# Patient Record
Sex: Female | Born: 1965 | Race: Black or African American | Hispanic: No | Marital: Married | Smoking: Never smoker
Health system: Southern US, Community
[De-identification: ages and names within clinical notes are randomized; demographics above are authoritative.]

## PROBLEM LIST (undated history)

## (undated) DIAGNOSIS — R002 Palpitations: Secondary | ICD-10-CM

## (undated) DIAGNOSIS — Q6589 Other specified congenital deformities of hip: Secondary | ICD-10-CM

## (undated) DIAGNOSIS — M1990 Unspecified osteoarthritis, unspecified site: Secondary | ICD-10-CM

## (undated) DIAGNOSIS — F32A Depression, unspecified: Secondary | ICD-10-CM

## (undated) DIAGNOSIS — F419 Anxiety disorder, unspecified: Secondary | ICD-10-CM

## (undated) HISTORY — DX: Unspecified osteoarthritis, unspecified site: M19.90

## (undated) HISTORY — PX: JOINT REPLACEMENT: SHX530

## (undated) HISTORY — PX: BUNIONECTOMY: SHX129

## (undated) HISTORY — PX: MANDIBLE SURGERY: SHX707

## (undated) HISTORY — DX: Anxiety disorder, unspecified: F41.9

## (undated) HISTORY — DX: Depression, unspecified: F32.A

---

## 2005-11-20 ENCOUNTER — Ambulatory Visit: Payer: Self-pay

## 2009-03-05 ENCOUNTER — Emergency Department: Payer: Self-pay | Admitting: Unknown Physician Specialty

## 2009-03-07 ENCOUNTER — Ambulatory Visit: Payer: Self-pay

## 2009-03-19 ENCOUNTER — Ambulatory Visit: Payer: Self-pay

## 2009-08-07 ENCOUNTER — Ambulatory Visit: Payer: Self-pay | Admitting: Obstetrics and Gynecology

## 2010-10-02 ENCOUNTER — Ambulatory Visit: Payer: Self-pay

## 2011-06-24 ENCOUNTER — Other Ambulatory Visit (HOSPITAL_COMMUNITY): Payer: Self-pay

## 2011-06-30 ENCOUNTER — Encounter (HOSPITAL_COMMUNITY): Admission: RE | Payer: Self-pay | Source: Ambulatory Visit

## 2011-06-30 ENCOUNTER — Ambulatory Visit (HOSPITAL_COMMUNITY): Admission: RE | Admit: 2011-06-30 | Payer: Self-pay | Source: Ambulatory Visit | Admitting: Obstetrics and Gynecology

## 2011-06-30 SURGERY — LAPAROSCOPY OPERATIVE
Anesthesia: Choice

## 2011-08-16 ENCOUNTER — Ambulatory Visit: Payer: Self-pay | Admitting: Podiatry

## 2011-10-03 ENCOUNTER — Ambulatory Visit: Payer: Self-pay

## 2012-10-04 ENCOUNTER — Ambulatory Visit: Payer: Self-pay

## 2013-10-05 ENCOUNTER — Ambulatory Visit: Payer: Self-pay

## 2015-09-24 ENCOUNTER — Other Ambulatory Visit: Payer: Self-pay | Admitting: Obstetrics and Gynecology

## 2015-09-24 DIAGNOSIS — Z1231 Encounter for screening mammogram for malignant neoplasm of breast: Secondary | ICD-10-CM

## 2015-10-04 ENCOUNTER — Other Ambulatory Visit: Payer: Self-pay | Admitting: Obstetrics and Gynecology

## 2015-10-04 ENCOUNTER — Ambulatory Visit
Admission: RE | Admit: 2015-10-04 | Discharge: 2015-10-04 | Disposition: A | Discharge status: Home / Self Care | Payer: BC Managed Care – PPO | Source: Ambulatory Visit | Attending: Obstetrics and Gynecology | Admitting: Obstetrics and Gynecology

## 2015-10-04 DIAGNOSIS — Z1231 Encounter for screening mammogram for malignant neoplasm of breast: Secondary | ICD-10-CM | POA: Diagnosis not present

## 2016-10-01 ENCOUNTER — Other Ambulatory Visit: Payer: Self-pay | Admitting: Obstetrics and Gynecology

## 2016-10-01 DIAGNOSIS — Z1231 Encounter for screening mammogram for malignant neoplasm of breast: Secondary | ICD-10-CM

## 2016-10-14 ENCOUNTER — Ambulatory Visit
Admission: RE | Admit: 2016-10-14 | Discharge: 2016-10-14 | Disposition: A | Discharge status: Home / Self Care | Payer: BC Managed Care – PPO | Source: Ambulatory Visit | Attending: Obstetrics and Gynecology | Admitting: Obstetrics and Gynecology

## 2016-10-14 DIAGNOSIS — Z1231 Encounter for screening mammogram for malignant neoplasm of breast: Secondary | ICD-10-CM | POA: Diagnosis not present

## 2017-10-05 ENCOUNTER — Other Ambulatory Visit: Payer: Self-pay | Admitting: Obstetrics and Gynecology

## 2017-10-05 DIAGNOSIS — Z1231 Encounter for screening mammogram for malignant neoplasm of breast: Secondary | ICD-10-CM

## 2017-10-19 ENCOUNTER — Other Ambulatory Visit: Payer: Self-pay | Admitting: Student

## 2017-10-19 DIAGNOSIS — G8929 Other chronic pain: Secondary | ICD-10-CM

## 2017-10-19 DIAGNOSIS — M5412 Radiculopathy, cervical region: Secondary | ICD-10-CM

## 2017-10-19 DIAGNOSIS — M5442 Lumbago with sciatica, left side: Secondary | ICD-10-CM

## 2017-10-21 ENCOUNTER — Ambulatory Visit
Admission: RE | Admit: 2017-10-21 | Discharge: 2017-10-21 | Disposition: A | Discharge status: Home / Self Care | Payer: BC Managed Care – PPO | Source: Ambulatory Visit | Attending: Obstetrics and Gynecology | Admitting: Obstetrics and Gynecology

## 2017-10-21 DIAGNOSIS — Z1231 Encounter for screening mammogram for malignant neoplasm of breast: Secondary | ICD-10-CM

## 2017-10-22 ENCOUNTER — Other Ambulatory Visit: Payer: Self-pay | Admitting: Obstetrics and Gynecology

## 2017-10-22 DIAGNOSIS — N6489 Other specified disorders of breast: Secondary | ICD-10-CM

## 2017-10-22 DIAGNOSIS — R928 Other abnormal and inconclusive findings on diagnostic imaging of breast: Secondary | ICD-10-CM

## 2017-10-28 ENCOUNTER — Ambulatory Visit
Admission: RE | Admit: 2017-10-28 | Discharge: 2017-10-28 | Disposition: A | Discharge status: Home / Self Care | Payer: BC Managed Care – PPO | Source: Ambulatory Visit | Attending: Obstetrics and Gynecology | Admitting: Obstetrics and Gynecology

## 2017-10-28 DIAGNOSIS — N6489 Other specified disorders of breast: Secondary | ICD-10-CM | POA: Diagnosis present

## 2017-10-28 DIAGNOSIS — R928 Other abnormal and inconclusive findings on diagnostic imaging of breast: Secondary | ICD-10-CM | POA: Diagnosis not present

## 2017-11-03 ENCOUNTER — Ambulatory Visit
Admission: RE | Admit: 2017-11-03 | Discharge: 2017-11-03 | Disposition: A | Discharge status: Home / Self Care | Payer: BC Managed Care – PPO | Source: Ambulatory Visit | Attending: Student | Admitting: Student

## 2017-11-03 DIAGNOSIS — G8929 Other chronic pain: Secondary | ICD-10-CM | POA: Diagnosis present

## 2017-11-03 DIAGNOSIS — M5412 Radiculopathy, cervical region: Secondary | ICD-10-CM | POA: Insufficient documentation

## 2017-11-03 DIAGNOSIS — M48061 Spinal stenosis, lumbar region without neurogenic claudication: Secondary | ICD-10-CM | POA: Insufficient documentation

## 2017-11-03 DIAGNOSIS — M5442 Lumbago with sciatica, left side: Principal | ICD-10-CM

## 2017-11-03 DIAGNOSIS — M4802 Spinal stenosis, cervical region: Secondary | ICD-10-CM | POA: Insufficient documentation

## 2017-11-03 DIAGNOSIS — M2578 Osteophyte, vertebrae: Secondary | ICD-10-CM | POA: Diagnosis not present

## 2018-10-20 ENCOUNTER — Other Ambulatory Visit: Payer: Self-pay | Admitting: Obstetrics and Gynecology

## 2018-10-20 DIAGNOSIS — Z1231 Encounter for screening mammogram for malignant neoplasm of breast: Secondary | ICD-10-CM

## 2018-11-29 ENCOUNTER — Ambulatory Visit
Admission: RE | Admit: 2018-11-29 | Discharge: 2018-11-29 | Disposition: A | Discharge status: Home / Self Care | Payer: BC Managed Care – PPO | Source: Ambulatory Visit | Attending: Obstetrics and Gynecology | Admitting: Obstetrics and Gynecology

## 2018-11-29 DIAGNOSIS — Z1231 Encounter for screening mammogram for malignant neoplasm of breast: Secondary | ICD-10-CM | POA: Diagnosis present

## 2019-02-23 ENCOUNTER — Ambulatory Visit: Payer: BC Managed Care – PPO | Admitting: Podiatry

## 2019-03-16 ENCOUNTER — Encounter: Payer: BC Managed Care – PPO | Admitting: Podiatry

## 2019-03-16 ENCOUNTER — Ambulatory Visit: Payer: BC Managed Care – PPO

## 2019-03-21 ENCOUNTER — Ambulatory Visit: Payer: BC Managed Care – PPO | Admitting: Podiatry

## 2019-03-21 DIAGNOSIS — R002 Palpitations: Secondary | ICD-10-CM | POA: Insufficient documentation

## 2019-03-21 DIAGNOSIS — I491 Atrial premature depolarization: Secondary | ICD-10-CM | POA: Insufficient documentation

## 2019-03-22 NOTE — Progress Notes (Signed)
This encounter was created in error - please disregard.

## 2019-04-04 ENCOUNTER — Ambulatory Visit (INDEPENDENT_AMBULATORY_CARE_PROVIDER_SITE_OTHER): Payer: BC Managed Care – PPO

## 2019-04-04 ENCOUNTER — Encounter: Payer: Self-pay | Admitting: Podiatry

## 2019-04-04 ENCOUNTER — Ambulatory Visit: Payer: BC Managed Care – PPO | Admitting: Podiatry

## 2019-04-04 ENCOUNTER — Other Ambulatory Visit: Payer: Self-pay

## 2019-04-04 DIAGNOSIS — M2011 Hallux valgus (acquired), right foot: Secondary | ICD-10-CM

## 2019-04-04 DIAGNOSIS — I493 Ventricular premature depolarization: Secondary | ICD-10-CM | POA: Insufficient documentation

## 2019-04-04 DIAGNOSIS — M2041 Other hammer toe(s) (acquired), right foot: Secondary | ICD-10-CM | POA: Diagnosis not present

## 2019-04-04 DIAGNOSIS — M2012 Hallux valgus (acquired), left foot: Secondary | ICD-10-CM | POA: Diagnosis not present

## 2019-04-04 DIAGNOSIS — M2042 Other hammer toe(s) (acquired), left foot: Secondary | ICD-10-CM

## 2019-04-04 NOTE — Patient Instructions (Signed)
Pre-Operative Instructions  Congratulations, you have decided to take an important step towards improving your quality of life.  You can be assured that the doctors and staff at [facility] will be with you every step of the way.  Here are some important things you should know:  1. Plan to be at the surgery center/hospital at least 1 (one) hour prior to your scheduled time, unless otherwise directed by the surgical center/hospital staff.  You must have a responsible adult accompany you, remain during the surgery and drive you home.  Make sure you have directions to the surgical center/hospital to ensure you arrive on time. 2. If you are having surgery at [facility] or [facility], you will need a copy of your medical history and physical form from your family physician within one month prior to the date of surgery. We will give you a form for your primary physician to complete.  3. We make every effort to accommodate the date you request for surgery.  However, there are times where surgery dates or times have to be moved.  We will contact you as soon as possible if a change in schedule is required.   4. No aspirin/ibuprofen for one week before surgery.  If you are on aspirin, any non-steroidal anti-inflammatory medications (Mobic, Aleve, Ibuprofen) should not be taken seven (7) days prior to your surgery.  You make take Tylenol for pain prior to surgery.  5. Medications - If you are taking daily heart and blood pressure medications, seizure, reflux, allergy, asthma, anxiety, pain or diabetes medications, make sure you notify the surgery center/hospital before the day of surgery so they can tell you which medications you should take or avoid the day of surgery. 6. No food or drink after midnight the night before surgery unless directed otherwise by surgical center/hospital staff. 7. No alcoholic beverages 24-hours prior to surgery.  No smoking 24-hours prior or 24-hours after  surgery. 8. Wear loose pants or shorts. They should be loose enough to fit over bandages, boots, and casts. 9. Don't wear slip-on shoes. Sneakers are preferred. 10. Bring your boot with you to the surgery center/hospital.  Also bring crutches or a Carmack if your physician has prescribed it for you.  If you do not have this equipment, it will be provided for you after surgery. 11. If you have not been contacted by the surgery center/hospital by the day before your surgery, call to confirm the date and time of your surgery. 12. Leave-time from work may vary depending on the type of surgery you have.  Appropriate arrangements should be made prior to surgery with your employer. 13. Prescriptions will be provided immediately following surgery by your doctor.  Fill these as soon as possible after surgery and take the medication as directed. Pain medications will not be refilled on weekends and must be approved by the doctor. 14. Remove nail polish on the operative foot and avoid getting pedicures prior to surgery. 15. Wash the night before surgery.  The night before surgery wash the foot and leg well with water and the antibacterial soap provided. Be sure to pay special attention to beneath the toenails and in between the toes.  Wash for at least three (3) minutes. Rinse thoroughly with water and dry well with a towel.  Perform this wash unless told not to do so by your physician.  Enclosed: 1 Ice pack (please put in freezer the night before surgery)   1 Hibiclens skin cleaner     Pre-op instructions  If you have any questions regarding the instructions, please do not hesitate to call our office.  [place]: [address], [place], [place] [address] -- [phone]  [place]: [address] [place], [place] [address] -- [phone]  [place]: [address], [place], [place] [address] -- [phone]   Website: [email or web address] 

## 2019-04-04 NOTE — Progress Notes (Signed)
Subjective:  Patient ID: Tiffany Rasmussen, female    DOB: 12-24-1965,  MRN: [id number] HPI Chief Complaint  Patient presents with  . Bunions    Patient presents today for painful bunion left foot x years.  She says its now effecting her gait.  She says she has random sharp pains and feels like a needle is sticking her foot.  She has tried diffrent insoles and shoes for relief    53 y.o. female presents with the above complaint.   ROS: Denies fever chills nausea vomiting muscle aches pains calf pain back pain chest pain shortness of breath.  Does relate some cardiac issues that she is currently being evaluated for.  No past medical history on file. No past surgical history on file.  Current Outpatient Medications:  .  Probiotic Product (PROBIOTIC DAILY PO), Take by mouth., Disp: , Rfl:  .  aspirin EC 81 MG tablet, Take by mouth., Disp: , Rfl:  .  cyclobenzaprine (FLEXERIL) 10 MG tablet, Take 5-10 mg by mouth at bedtime as needed., Disp: , Rfl:  .  LORazepam (ATIVAN) 0.5 MG tablet, TAKE 1 TABLET BY MOUTH EVERY 8 HOURS AS NEEDED FOR ANXIETY, Disp: , Rfl:  .  magnesium oxide (MAG-OX) 400 MG tablet, Take by mouth., Disp: , Rfl:  .  Multiple Vitamin (MULTI-VITAMIN) tablet, Take by mouth., Disp: , Rfl:  .  vitamin k 100 MCG tablet, Take by mouth., Disp: , Rfl:   Allergies  Allergen Reactions  . Lactose Intolerance (Gi) Other (See Comments)    GI upset and bloating   Review of Systems Objective:  There were no vitals filed for this visit.  General: Well developed, nourished, in no acute distress, alert and oriented x3   Dermatological: Skin is warm, dry and supple bilateral. Nails x 10 are well maintained; remaining integument appears unremarkable at this time. There are no open sores, no preulcerative lesions, no rash or signs of infection present.  Vascular: Dorsalis Pedis artery and Posterior Tibial artery pedal pulses are 2/4 bilateral with immedate capillary fill time. Pedal hair  growth present. No varicosities and no lower extremity edema present bilateral.   Neruologic: Grossly intact via light touch bilateral. Vibratory intact via tuning fork bilateral. Protective threshold with Semmes Wienstein monofilament intact to all pedal sites bilateral. Patellar and Achilles deep tendon reflexes 2+ bilateral. No Babinski or clonus noted bilateral.   Musculoskeletal: No gross boney pedal deformities bilateral. No pain, crepitus, or limitation noted with foot and ankle range of motion bilateral. Muscular strength 5/5 in all groups tested bilateral.  Gait: Unassisted, Nonantalgic.    Radiographs:  Radiographs taken today demonstrate an osseously mature individual with pes planus mild to moderate tailor's bunion deformity as well as increase in the first intermetatarsal angle greater than normal value with an excessively long second metatarsal.  This is all noted on the left foot.  No acute findings.  Assessment & Plan:   Assessment: Hallux valgus left second metatarsal plantarflexed left tailor's bunion deformity left  Plan: Discussed etiology pathology conservative versus surgical therapies.  At this point we consented her today for an [facility] bunion repair second metatarsal osteotomy and 1/5 metatarsal osteotomy we did discuss the possible postop complications which may include but not limited to postop pain bleeding swelling infection recurrence need for further surgery overcorrection under correction loss of digit loss of limb loss of life.  We will wait for cardiac clearance.  We did dispense a Cam Shiraishi today information regarding the surgery  center anesthesia as well as instructions for the morning of surgery.     Dorthea Maina T. [place], [place]

## 2019-04-05 ENCOUNTER — Telehealth: Payer: Self-pay | Admitting: Podiatry

## 2019-04-05 NOTE — Telephone Encounter (Signed)
Called pt to get her scheduled for surgery. Ended up scheduling pt for 04/29/2019 but pt will let me know if she cannot do that day and she would have to schedule for 10/07/2019. I told her if she had to schedule that date, she would have to come back in to resign her consent forms as they are only good for six months. Told pt to call me with any questions and/or concerns.

## 2019-04-05 NOTE — Telephone Encounter (Signed)
I'm calling to schedule surgery on my left foot with Dr. Milinda Pointer.

## 2019-04-06 ENCOUNTER — Telehealth: Payer: Self-pay | Admitting: Podiatry

## 2019-04-06 NOTE — Telephone Encounter (Signed)
Pt called to reschedule her surgery to 07 October 2019 due to work. I told pt she would need to come back in to see Dr. Milinda Pointer to resign her consent forms as they are only good for six months. I went ahead and scheduled pt to come in on Monday, 07 June 21 at 3:45 pm. Told pt to call with any questions/concerns she may have.

## 2019-05-31 IMAGING — MR MR LUMBAR SPINE W/O CM
5 series · 37 of 48 positions shown · non-contrast
Comparison: None.

CLINICAL DATA: Low back pain radiating to the left leg

EXAM:
MRI LUMBAR SPINE WITHOUT CONTRAST
TECHNIQUE: Multiplanar, multisequence MR imaging of the lumbar spine was
performed. No intravenous contrast was administered.

[Series 3: T2 · sagittal · 4.0mm · 0.88mm/px · 7 of 19 slices shown (1 of 2)]
[im 1/19]
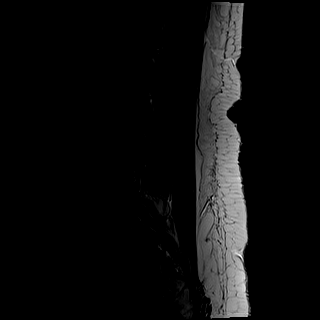
[im 4/19]
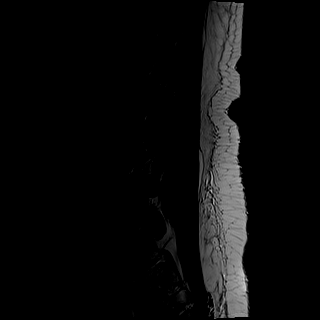
[im 7/19]
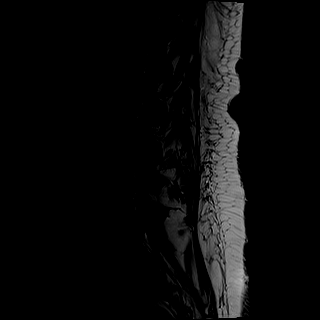
[im 10/19]
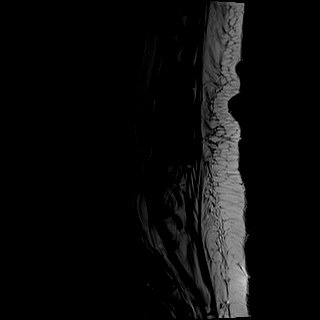
[im 13/19]
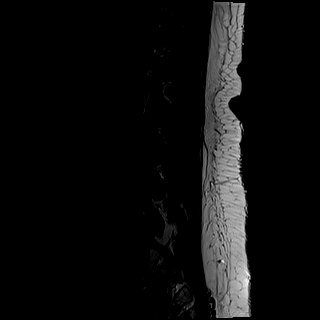
[im 16/19]
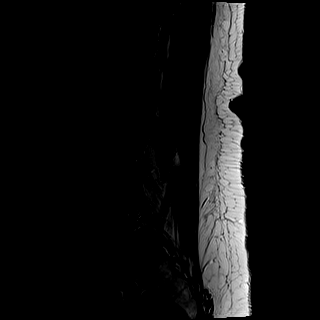
[im 19/19]
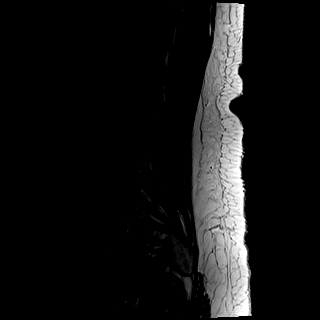

[Series 4: T1 · sagittal · 4.0mm · 0.88mm/px · 7 of 19 slices shown (1 of 2)]
[im 1/19]
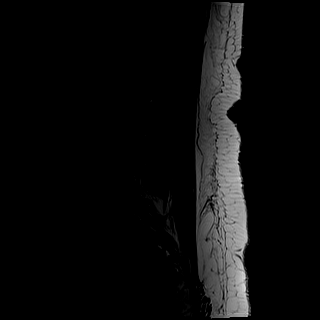
[im 4/19]
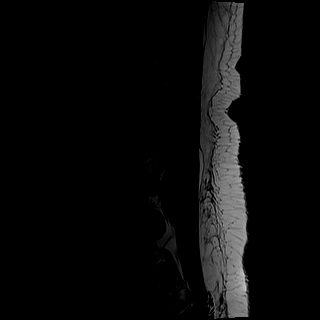
[im 7/19]
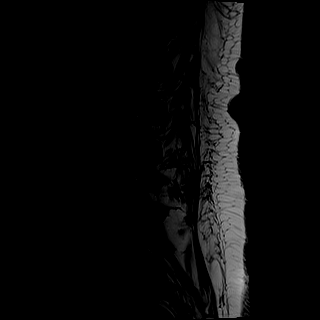
[im 10/19]
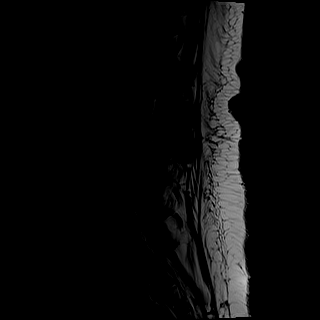
[im 13/19]
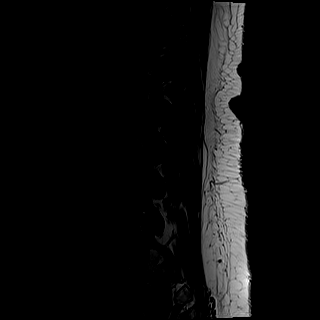
[im 16/19]
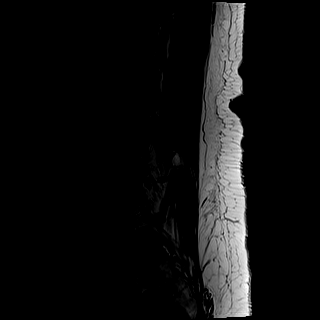
[im 19/19]
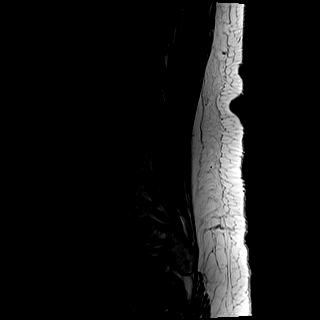

[Series 5: STIR · sagittal · 4.0mm · 0.88mm/px · 6 of 19 slices shown]
[im 1/19]
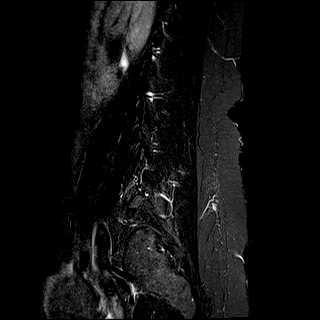
[im 4/19]
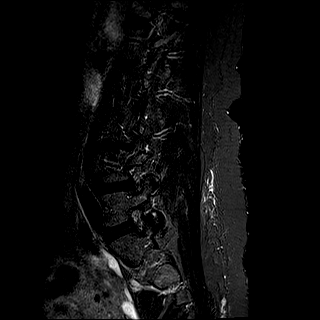
[im 8/19]
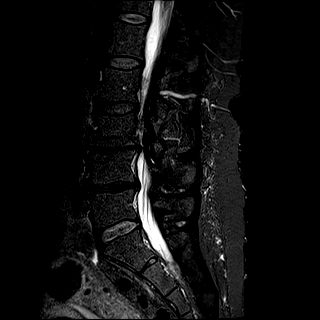
[im 11/19]
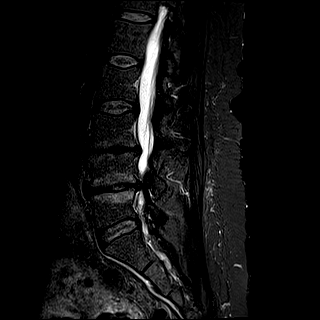
[im 15/19]
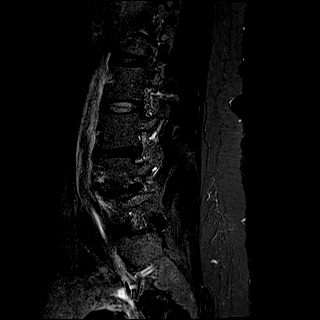
[im 19/19]
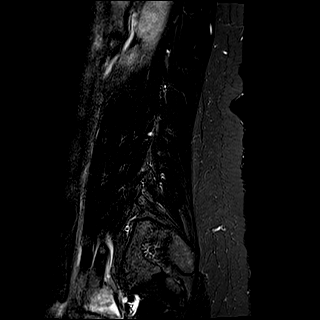

[Series 6: T2 · axial · 4.0mm · 0.78mm/px · z∈[-110,+117]mm · 9 of 42 slices shown (2 of 2)]
[im 1/42]
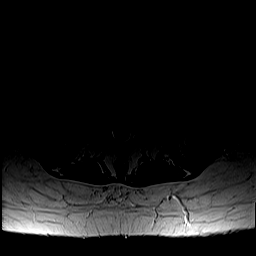
[im 4/42]
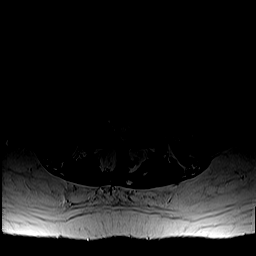
[im 7/42]
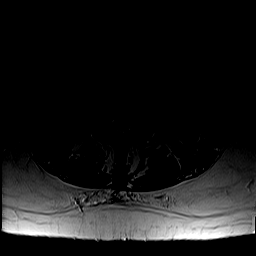
[im 13/42]
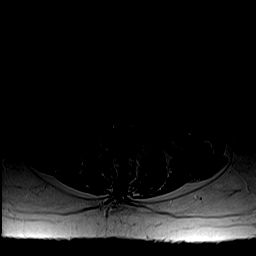
[im 19/42]
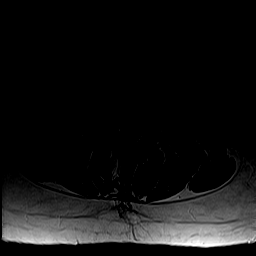
[im 23/42]
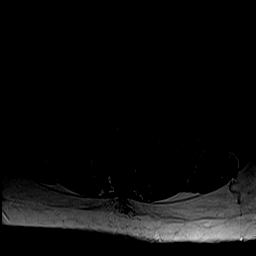
[im 29/42]
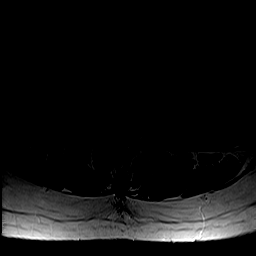
[im 35/42]
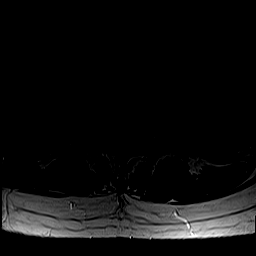
[im 42/42]
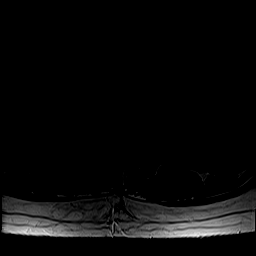

[Series 7: T1 · axial · 4.0mm · 0.39mm/px · z∈[-110,+117]mm · 8 of 42 slices shown (2 of 2)]
[im 1/42]
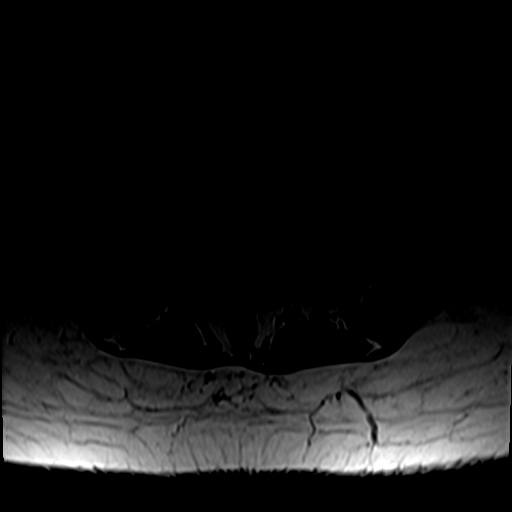
[im 7/42]
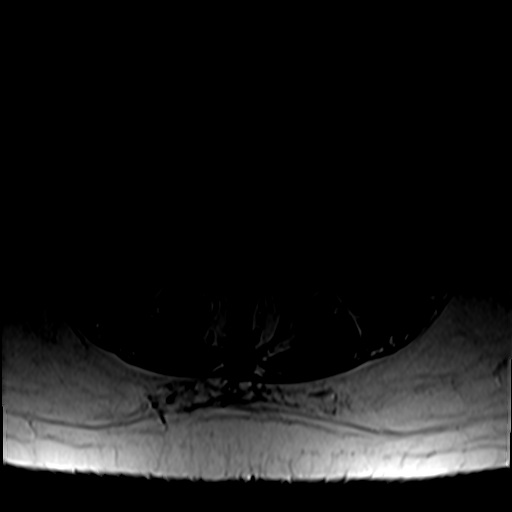
[im 13/42]
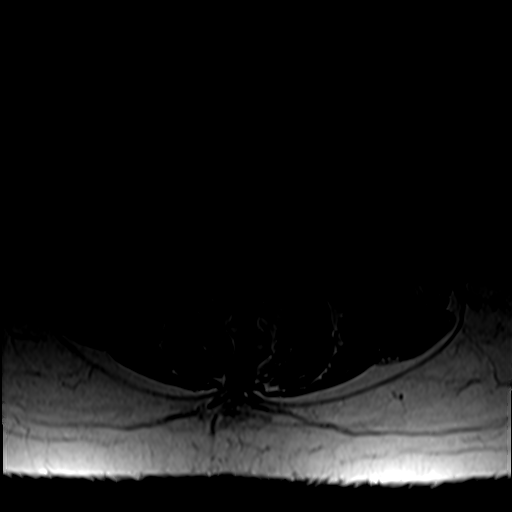
[im 19/42]
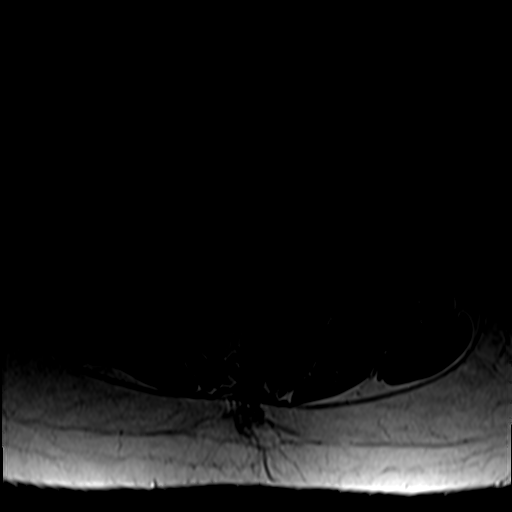
[im 23/42]
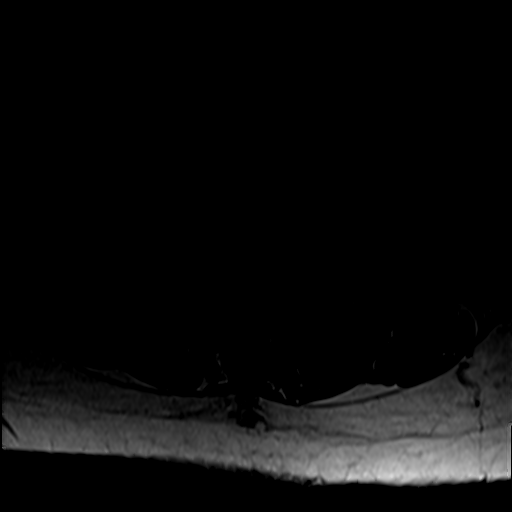
[im 29/42]
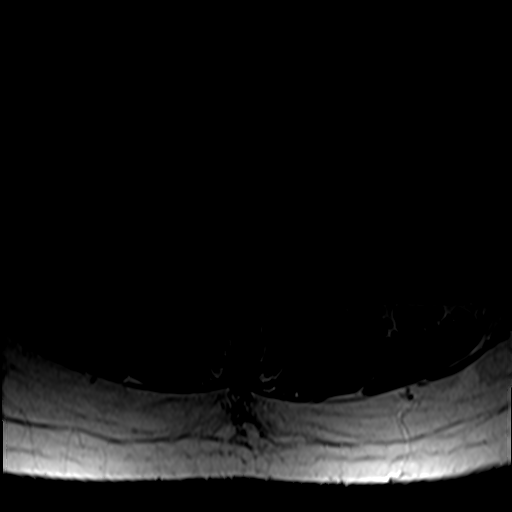
[im 35/42]
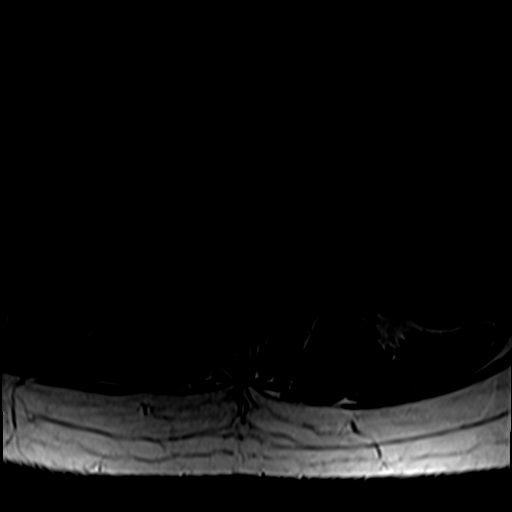
[im 42/42]
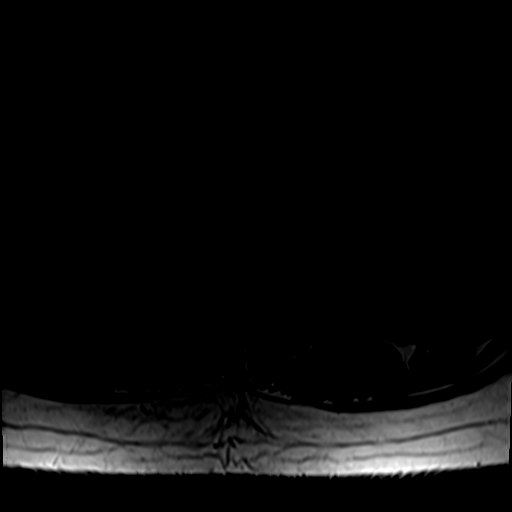

[37 of 48 positions shown; findings below may reference images not displayed]

FINDINGS: Segmentation: Normal. The lowest disc space is considered to be
L5-S1.

Alignment:  Normal

Vertebrae: No acute compression fracture, discitis-osteomyelitis of
focal marrow lesion.

Conus medullaris and cauda equina: The conus medullaris terminates
at the T12 level. The cauda equina and conus medullaris are both
normal.

Paraspinal and other soft tissues: Duplicated IVC.

Disc levels:

The T12-L3 disc levels are normal.

L3-L4: Disc space narrowing with large endplate osteophytes and
diffuse disc bulge. Narrowing of the lateral recesses, right greater
than left. No central spinal canal stenosis. Moderate right neural
foraminal stenosis.

L4-L5: Disc space narrowing with endplate osteophytes and left
eccentric disc bulge. There is narrowing of the
left-greater-than-right lateral recesses. Mild right and severe left
neural foraminal stenosis.

L5-S1: Minimal disc bulge. No spinal canal or neural foraminal
stenosis.

The visualized sacrum is normal.
IMPRESSION: 1. Severe left L4 neural foraminal stenosis and L4-5 left lateral
recess stenosis. These findings could contribute to left L4 and/or
L5 distribution radiculopathy.
2. Moderate right L3 neural foraminal stenosis.

## 2019-06-24 ENCOUNTER — Ambulatory Visit: Payer: Self-pay | Admitting: *Deleted

## 2019-06-24 NOTE — Telephone Encounter (Signed)
Pt's husband calling with concerns of the patient getting the Covid vaccine on Monday and having a nose bleed today. Pt states that she blew her nose one time today and noted mild bleeding. No bleeding at the time of call. Pt's husband advised that nose bleeding was not one of the common side effects after receiving the Covid vaccine. Pt states she has experienced a nose bleed previously. Home care advise given and patient advised to seek treatment in ED/Urgent care if she becomes worse. Understanding verbalized.   Reason for Disposition . [1] Mild-moderate nosebleed AND [2] bleeding stopped now  Answer Assessment - Initial Assessment Questions 1. AMOUNT OF BLEEDING: "How bad is the bleeding?" "How much blood was lost?" "Has the bleeding stopped?"   - MILD: needed a couple tissues   - MODERATE: needed many tissues   - SEVERE: large blood clots, soaked many tissues, lasted more than 30 minutes      mild 2. ONSET: "When did the nosebleed start?"      Around 6 pm 3. FREQUENCY: "How many nosebleeds have you had in the last 24 hours?"      Yes but not recently 4. RECURRENT SYMPTOMS: "Have there been other recent nosebleeds?" If so, ask: "How long did it take you to stop the bleeding?" "What worked best?"      Felt like nose was trying to run, with blowing nose, just one time 5. CAUSE: "What do you think caused this nosebleed?"     n/a 6. LOCAL FACTORS: "Do you have any cold symptoms?", "Have you been rubbing or picking at your nose?"     no 7. SYSTEMIC FACTORS: "Do you have high blood pressure or any bleeding problems?"     Not generally, BP borderline but not high 8. BLOOD THINNERS: "Do you take any blood thinners?" (e.g., coumadin, heparin, aspirin, Plavix)     Aspirin 9. OTHER SYMPTOMS: "Do you have any other symptoms?" (e.g., lightheadedness)     While getting ready for work this morning but not currently 10. PREGNANCY: "Is there any chance you are pregnant?" "When was your last menstrual  period?"       n/a  Protocols used: NOSEBLEED-A-AH

## 2019-09-19 ENCOUNTER — Encounter: Payer: Self-pay | Admitting: Podiatry

## 2019-09-19 ENCOUNTER — Other Ambulatory Visit: Payer: Self-pay

## 2019-09-19 ENCOUNTER — Ambulatory Visit: Payer: BC Managed Care – PPO | Admitting: Podiatry

## 2019-09-19 DIAGNOSIS — M21962 Unspecified acquired deformity of left lower leg: Secondary | ICD-10-CM | POA: Diagnosis not present

## 2019-09-19 DIAGNOSIS — M21622 Bunionette of left foot: Secondary | ICD-10-CM | POA: Diagnosis not present

## 2019-09-19 DIAGNOSIS — M2012 Hallux valgus (acquired), left foot: Secondary | ICD-10-CM | POA: Diagnosis not present

## 2019-09-19 NOTE — Progress Notes (Signed)
She presents today to once again go over the consent for her surgery that we will to do on the 25th of this month.  She still has pain along the first metatarsophalangeal joint second metatarsophalangeal joint fifth metatarsophalangeal joints.  She states that is becoming more painful all the time and she would like to have this surgically corrected.  We have previously gone over this and signed it but she was not ready to have it done in January so we extended out till now.  Objective: Vital signs are stable she alert oriented x3 no change in physical exam have hallux abductovalgus deformity plantarflexed elongated second metatarsal mild hammertoe deformity second and a tailor's bunion deformity fifth left.  No open lesions or wounds pulses remain strong and palpable.  No erythema cellulitis drainage or odor just tenderness on attempted range of motion.  Assessment: Hallux valgus deformity plantarflexed second metatarsal and toes bunion deformity left foot.  Plan: We went over the consent form today once again landline number by number gave her at the time as question she is off it regarding these procedures answered a mild to the best my billing layman's terms.  She understood and was amenable to it signed Merton Border page of the consent form.  And I will follow-up with her in the near future for surgical intervention.  We will ask her cardiologist for clearance for anesthesia only.

## 2019-09-22 ENCOUNTER — Encounter: Payer: Self-pay | Admitting: Podiatry

## 2019-09-23 ENCOUNTER — Telehealth: Payer: Self-pay

## 2019-09-23 NOTE — Telephone Encounter (Signed)
Received medical clearance from Dr. Marcina Millard. He stated "may hold ASA 7 days prior to procedure. Pt  Is at low risk for cardiac complication due to procedure". I relayed this information to [employer] and she stated she understood.

## 2019-09-23 NOTE — Telephone Encounter (Signed)
DOS 10/07/2019  [place] BUNIONECTOMY LT - 28296 METATARSAL OSTEOTOMY 2,5 LT - 28308  [employer] STATE EFFECTIVE DATE - 04/15/2019  PLAN DEDUCTIBLE - $1500.00 W/ $1500 REMAINING OUT OF POCKET - $5900.00 W/ $8887.57 REMAINING COPAY $0.00 COINSURANCE - 30%  NO AUTH REQUIRED

## 2019-10-05 ENCOUNTER — Other Ambulatory Visit: Payer: Self-pay | Admitting: Podiatry

## 2019-10-05 MED ORDER — CEPHALEXIN 500 MG PO CAPS: 500.0000 mg | ORAL_CAPSULE | Freq: Three times a day (TID) | ORAL | 0 refills | Status: DC

## 2019-10-05 MED ORDER — OXYCODONE-ACETAMINOPHEN 10-325 MG PO TABS: 1.0000 | ORAL_TABLET | Freq: Three times a day (TID) | ORAL | 0 refills | Status: DC | PRN

## 2019-10-05 MED ORDER — ONDANSETRON HCL 4 MG PO TABS: 4.0000 mg | ORAL_TABLET | Freq: Three times a day (TID) | ORAL | 0 refills | Status: AC | PRN

## 2019-10-07 DIAGNOSIS — M21542 Acquired clubfoot, left foot: Secondary | ICD-10-CM

## 2019-10-07 DIAGNOSIS — M2012 Hallux valgus (acquired), left foot: Secondary | ICD-10-CM

## 2019-10-10 ENCOUNTER — Telehealth: Payer: Self-pay | Admitting: Podiatry

## 2019-10-10 NOTE — Telephone Encounter (Signed)
Left message for pt to call with the type of pain and degree that I could inform our doctor-on-call.

## 2019-10-10 NOTE — Telephone Encounter (Signed)
Pt husband called and stated that Tiffany Rasmussen need a refill on pain medication and flexeril stated that she had been in a lot of pain and was tsking more of her medication she is in extreme pain

## 2019-10-12 ENCOUNTER — Encounter: Payer: Self-pay | Admitting: Podiatry

## 2019-10-12 ENCOUNTER — Ambulatory Visit: Payer: BC Managed Care – PPO

## 2019-10-12 ENCOUNTER — Ambulatory Visit (INDEPENDENT_AMBULATORY_CARE_PROVIDER_SITE_OTHER): Payer: BC Managed Care – PPO | Admitting: Podiatry

## 2019-10-12 ENCOUNTER — Other Ambulatory Visit: Payer: Self-pay

## 2019-10-12 DIAGNOSIS — M21962 Unspecified acquired deformity of left lower leg: Secondary | ICD-10-CM

## 2019-10-12 DIAGNOSIS — M21622 Bunionette of left foot: Secondary | ICD-10-CM

## 2019-10-12 DIAGNOSIS — M2012 Hallux valgus (acquired), left foot: Secondary | ICD-10-CM

## 2019-10-12 DIAGNOSIS — Z9889 Other specified postprocedural states: Secondary | ICD-10-CM

## 2019-10-12 MED ORDER — OXYCODONE-ACETAMINOPHEN 10-325 MG PO TABS: 1.0000 | ORAL_TABLET | Freq: Three times a day (TID) | ORAL | 0 refills | Status: AC | PRN

## 2019-10-12 NOTE — Progress Notes (Signed)
She presents today status post Eliberto Ivory bunionectomy second and fifth metatarsal osteotomies date of surgery 10/07/2019 states that is been exquisitely sore and painful and she is has not been walking on it she has been using wheelchairs or crutches.  Denies fever chills nausea vomiting muscle aches and pain shortness of breath chest pain.  States that she has used all of her pain medication and needs a refill.  Objective: Vital signs are stable alert oriented x3.  Pulses are palpable.  Dry sterile dressing was removed demonstrates some edema minimal erythema no cellulitis drainage or odor no ecchymosis.  Sutures are intact margins well coapted she has had some slight drainage from the center incision.  Radiographs taken today demonstrate internal fixation is in good position alignment is perfect no motion in the internal fixation.  Assessment: Well-healing surgical foot.  Plan: Redressed today dressed a compressive dressing refilled her medication instructed her to start walking on it keep it up as much as she can encourage range of motion exercises follow-up with me in 1 week

## 2019-10-13 ENCOUNTER — Telehealth: Payer: Self-pay | Admitting: Podiatry

## 2019-10-13 MED ORDER — HYDROCODONE-ACETAMINOPHEN 10-325 MG PO TABS: 1.0000 | ORAL_TABLET | Freq: Three times a day (TID) | ORAL | 0 refills | Status: AC | PRN

## 2019-10-13 NOTE — Telephone Encounter (Signed)
Pt husband called and stated that the medication that was sent to pt pharmacy  Yesterday needed a pre auth for the pain medication please assist

## 2019-10-13 NOTE — Addendum Note (Signed)
Addended by: Ernestene Kiel T on: 10/13/2019 04:38 PM   Modules accepted: Orders

## 2019-10-13 NOTE — Telephone Encounter (Signed)
Tiffany Rasmussen has called to ask that a Prior Authrorization form be completed so that the medication can be picked up.

## 2019-10-14 ENCOUNTER — Telehealth: Payer: Self-pay | Admitting: Podiatry

## 2019-10-14 NOTE — Telephone Encounter (Signed)
Patient's husband has called to inquire about the Pre Authorization  form for Ms.Hagos's Medication. Please contact patient with an update.

## 2019-10-14 NOTE — Telephone Encounter (Signed)
Left message informing pt's husband, Dr. Al Corpus had changed the medication to one her insurance would cover.

## 2019-10-19 ENCOUNTER — Other Ambulatory Visit: Payer: Self-pay

## 2019-10-19 ENCOUNTER — Ambulatory Visit (INDEPENDENT_AMBULATORY_CARE_PROVIDER_SITE_OTHER): Payer: BC Managed Care – PPO | Admitting: Podiatry

## 2019-10-19 ENCOUNTER — Encounter: Payer: Self-pay | Admitting: Podiatry

## 2019-10-19 DIAGNOSIS — Z9889 Other specified postprocedural states: Secondary | ICD-10-CM

## 2019-10-19 DIAGNOSIS — M21962 Unspecified acquired deformity of left lower leg: Secondary | ICD-10-CM

## 2019-10-19 DIAGNOSIS — M2012 Hallux valgus (acquired), left foot: Secondary | ICD-10-CM

## 2019-10-19 DIAGNOSIS — M21622 Bunionette of left foot: Secondary | ICD-10-CM

## 2019-10-19 NOTE — Progress Notes (Signed)
She presents today for her second postop visit date of surgery 10/07/2019 status post Eliberto Ivory bunionectomy second metatarsal osteotomy fifth metatarsal osteotomy.  States that really the only problem she is having is constipation from the narcotics.  She says is feeling a lot better today than it has in the past.  Objective: Vital signs are stable alert oriented x3.  Dressed her dressing intact was removed demonstrates mild edema no erythema no cellulitis drainage or odor suture ends are intact margins remain well coapted went ahead and removed the margins for ends of the sutures today.  Margins remain well coapted.  She has good range of motion dorsiflexion plantarflexion without pain.  Assessment: Well-healing surgical foot.  Plan: Discussed etiology pathology conservative surgical therapies at this point put her in a compression anklet and a Darco shoe I will follow-up with her in 2 weeks encouraged to continue range of motion exercises and massage therapy to the foot.

## 2019-11-02 ENCOUNTER — Encounter: Payer: Self-pay | Admitting: Podiatry

## 2019-11-02 ENCOUNTER — Ambulatory Visit (INDEPENDENT_AMBULATORY_CARE_PROVIDER_SITE_OTHER): Payer: BC Managed Care – PPO

## 2019-11-02 ENCOUNTER — Ambulatory Visit (INDEPENDENT_AMBULATORY_CARE_PROVIDER_SITE_OTHER): Payer: BC Managed Care – PPO | Admitting: Podiatry

## 2019-11-02 ENCOUNTER — Other Ambulatory Visit: Payer: Self-pay

## 2019-11-02 DIAGNOSIS — M2012 Hallux valgus (acquired), left foot: Secondary | ICD-10-CM | POA: Diagnosis not present

## 2019-11-02 DIAGNOSIS — M21962 Unspecified acquired deformity of left lower leg: Secondary | ICD-10-CM

## 2019-11-02 DIAGNOSIS — M21622 Bunionette of left foot: Secondary | ICD-10-CM

## 2019-11-02 DIAGNOSIS — Z9889 Other specified postprocedural states: Secondary | ICD-10-CM

## 2019-11-02 NOTE — Progress Notes (Signed)
She presents today 1 month status post [place] bunionectomy metatarsal osteotomy second and fifth bilateral.  She states that it seems to be getting better.  Date of surgery 10/07/2019.  She denies fever chills nausea vomiting muscle aches pains calf pain back pain chest pain shortness of breath.  Objective: Vital signs are stable alert oriented x3 left foot demonstrates moderate edema no erythema cellulitis drainage or odor incisions are healing very nicely.  She has good range of motion of the first metatarsophalangeal joint passively and actively though could be improved upon.  Radiographs demonstrate today well-healing osteotomies with internal fixation in good position and no malalignment.  Assessment: Well-healing surgical foot left.  Plan: Encourage range of motion activities encouraged try to get back into a stiff shoe and I will follow-up with her in another 2 weeks.

## 2019-11-11 ENCOUNTER — Other Ambulatory Visit: Payer: Self-pay | Admitting: Obstetrics and Gynecology

## 2019-11-11 DIAGNOSIS — Z1231 Encounter for screening mammogram for malignant neoplasm of breast: Secondary | ICD-10-CM

## 2019-11-16 ENCOUNTER — Ambulatory Visit (INDEPENDENT_AMBULATORY_CARE_PROVIDER_SITE_OTHER): Payer: BC Managed Care – PPO | Admitting: Podiatry

## 2019-11-16 ENCOUNTER — Encounter: Payer: Self-pay | Admitting: Podiatry

## 2019-11-16 ENCOUNTER — Other Ambulatory Visit: Payer: Self-pay

## 2019-11-16 ENCOUNTER — Ambulatory Visit (INDEPENDENT_AMBULATORY_CARE_PROVIDER_SITE_OTHER): Payer: BC Managed Care – PPO

## 2019-11-16 DIAGNOSIS — M2012 Hallux valgus (acquired), left foot: Secondary | ICD-10-CM

## 2019-11-16 DIAGNOSIS — Z9889 Other specified postprocedural states: Secondary | ICD-10-CM | POA: Diagnosis not present

## 2019-11-16 NOTE — Progress Notes (Signed)
She presents today date of surgery 10/07/2019 status post first second and fifth metatarsal osteotomies she states that she seems to be doing pretty well she is now back in her regular shoes yet.  States that the lateral aspect of the fifth metatarsal osteotomy is the most painful.  She denies calf pain back pain chest pain shortness of breath.  Objective: Moderate edema left lower extremity postinflammatory hyperpigmentation she does have limitation in range of motion of the first metatarsophalangeal joint and some tenderness overlying the fifth metatarsophalangeal joint area.  Radiographically appears that she is healing very nicely with the exception of some slow healing overlying the fifth met osteotomy otherwise the second and the first are healing very nicely.  Assessment: Well-healing surgical foot just slow but I rather have more range of motion and she has at this point.  Plan: At this point were going to send her to physical therapy I am also encouraging regular tennis shoes to be put on first thing in the mornings then switch to the Darco shoe as inflammation progresses.  So I sent her to steroids physical therapy I will follow-up with her in 3 to 4 weeks to see how she is doing.

## 2019-12-01 ENCOUNTER — Ambulatory Visit
Admission: RE | Admit: 2019-12-01 | Discharge: 2019-12-01 | Disposition: A | Discharge status: Home / Self Care | Payer: BC Managed Care – PPO | Source: Ambulatory Visit | Attending: Obstetrics and Gynecology | Admitting: Obstetrics and Gynecology

## 2019-12-01 ENCOUNTER — Other Ambulatory Visit: Payer: Self-pay

## 2019-12-01 DIAGNOSIS — Z1231 Encounter for screening mammogram for malignant neoplasm of breast: Secondary | ICD-10-CM

## 2019-12-14 ENCOUNTER — Ambulatory Visit (INDEPENDENT_AMBULATORY_CARE_PROVIDER_SITE_OTHER): Payer: BC Managed Care – PPO | Admitting: Podiatry

## 2019-12-14 ENCOUNTER — Other Ambulatory Visit: Payer: Self-pay

## 2019-12-14 ENCOUNTER — Ambulatory Visit (INDEPENDENT_AMBULATORY_CARE_PROVIDER_SITE_OTHER): Payer: BC Managed Care – PPO

## 2019-12-14 ENCOUNTER — Encounter: Payer: Self-pay | Admitting: Podiatry

## 2019-12-14 DIAGNOSIS — M2012 Hallux valgus (acquired), left foot: Secondary | ICD-10-CM

## 2019-12-14 DIAGNOSIS — Z9889 Other specified postprocedural states: Secondary | ICD-10-CM

## 2019-12-14 NOTE — Progress Notes (Signed)
She presents today states that her bunionectomy and second metatarsal osteotomy are doing well however the fifth met osteotomy has been very painful particularly today states that is been swollen and is painful with her regular shoe gear.  Date of surgery 10/07/2019.  Objective: Vital signs are stable she is alert oriented x3 pulses are palpable.  She has moderate edema to the left foot minimal erythema cellulitis drainage or odor she has tenderness overlying the fifth metatarsal osteotomy site.  Radiographs taken today do demonstrate what appears to be a medial dislocation but not a complete fracture of the fifth metatarsal osteotomy there is appear to be some bone callus forming demonstrating some motion.  I went back and reevaluated previous radiographs which do demonstrate similar findings but to a lesser degree.  Most likely the cortex broke with this and is just will take a little longer for her to heal the alignment is actually better now than it was for a fracture.  Assessment: Well-healing surgical foot slow because of the fifth metatarsal but this will go on to heal.  Plan: 1 encouraged her to continue physical therapy I did encourage her to get back into her Darco shoe as needed.  I will follow-up with her in about 1 month.

## 2019-12-29 ENCOUNTER — Encounter: Payer: Self-pay | Admitting: *Deleted

## 2020-01-11 ENCOUNTER — Other Ambulatory Visit: Payer: Self-pay

## 2020-01-11 ENCOUNTER — Ambulatory Visit (INDEPENDENT_AMBULATORY_CARE_PROVIDER_SITE_OTHER): Payer: BC Managed Care – PPO | Admitting: Podiatry

## 2020-01-11 ENCOUNTER — Ambulatory Visit (INDEPENDENT_AMBULATORY_CARE_PROVIDER_SITE_OTHER): Payer: BC Managed Care – PPO

## 2020-01-11 ENCOUNTER — Encounter: Payer: Self-pay | Admitting: Podiatry

## 2020-01-11 DIAGNOSIS — M21622 Bunionette of left foot: Secondary | ICD-10-CM

## 2020-01-11 DIAGNOSIS — M2012 Hallux valgus (acquired), left foot: Secondary | ICD-10-CM

## 2020-01-11 DIAGNOSIS — M21962 Unspecified acquired deformity of left lower leg: Secondary | ICD-10-CM

## 2020-01-11 DIAGNOSIS — Z9889 Other specified postprocedural states: Secondary | ICD-10-CM

## 2020-01-11 NOTE — Progress Notes (Signed)
She presents today for follow-up of her Eliberto Ivory bunionectomy with fixation second and fifth metatarsals.  Last time she was in she had damage the fifth metatarsal osteotomy she thinks it was from doing a downward facing dog.  Objective: Vital signs are stable she is alert oriented x3.  Presents today ambulating Darco shoe with some mild tenderness to the posterior tibial tendon pulses remain palpable mild edema.  Radiographs taken today demonstrate a well-healing fifth metatarsal dislocation or fracture I internal fixation is still in good position but she has a bone callus surrounding the area.  This is consistent with bone healing.  Assessment: Well-healing surgical foot left.  Plan: We will let her try to get into her regular shoes and I will follow-up with her in 1 month.

## 2020-01-20 DIAGNOSIS — M79676 Pain in unspecified toe(s): Secondary | ICD-10-CM

## 2020-02-15 ENCOUNTER — Other Ambulatory Visit: Payer: Self-pay

## 2020-02-15 ENCOUNTER — Ambulatory Visit (INDEPENDENT_AMBULATORY_CARE_PROVIDER_SITE_OTHER): Payer: BC Managed Care – PPO | Admitting: Podiatry

## 2020-02-15 ENCOUNTER — Ambulatory Visit: Payer: BC Managed Care – PPO

## 2020-02-15 ENCOUNTER — Encounter: Payer: Self-pay | Admitting: Podiatry

## 2020-02-15 DIAGNOSIS — Z9889 Other specified postprocedural states: Secondary | ICD-10-CM | POA: Diagnosis not present

## 2020-02-15 DIAGNOSIS — M2012 Hallux valgus (acquired), left foot: Secondary | ICD-10-CM

## 2020-02-15 NOTE — Progress Notes (Signed)
She presents today for follow-up of her left foot states that every day is a little bit better still has some stiffness when I walk but other than that is doing quite well.  She states that she is tried her regular shoes on and she presents today in a pair of beach sandals.  Objective: Vital signs are stable she is alert and oriented x3.  Swelling is gone down over the fifth metatarsal osteotomy site which had distracted last visit due to most likely overactivity.  Assessment: Resolving surgical delays including distraction of osteotomy site and scar tissue.  Plan: Discussed etiology pathology conservative surgical therapies at this point time I recommended that she continue physical therapy at home and try Voltaren gel if necessary.  I would like to follow-up with her in about 6 weeks for another set of x-rays and for her to get back into her regular shoes.

## 2020-02-16 NOTE — Addendum Note (Signed)
Addended by: Kristian Covey on: 02/16/2020 08:45 AM   Modules accepted: Level of Service

## 2020-04-04 ENCOUNTER — Ambulatory Visit: Payer: BC Managed Care – PPO | Admitting: Podiatry

## 2020-04-11 ENCOUNTER — Other Ambulatory Visit: Payer: Self-pay

## 2020-04-11 ENCOUNTER — Ambulatory Visit (INDEPENDENT_AMBULATORY_CARE_PROVIDER_SITE_OTHER): Payer: BC Managed Care – PPO

## 2020-04-11 ENCOUNTER — Encounter: Payer: Self-pay | Admitting: Podiatry

## 2020-04-11 ENCOUNTER — Ambulatory Visit (INDEPENDENT_AMBULATORY_CARE_PROVIDER_SITE_OTHER): Payer: BC Managed Care – PPO | Admitting: Podiatry

## 2020-04-11 DIAGNOSIS — M21622 Bunionette of left foot: Secondary | ICD-10-CM

## 2020-04-11 DIAGNOSIS — M2012 Hallux valgus (acquired), left foot: Secondary | ICD-10-CM | POA: Diagnosis not present

## 2020-04-11 DIAGNOSIS — M21962 Unspecified acquired deformity of left lower leg: Secondary | ICD-10-CM | POA: Diagnosis not present

## 2020-04-11 DIAGNOSIS — M2041 Other hammer toe(s) (acquired), right foot: Secondary | ICD-10-CM

## 2020-04-11 DIAGNOSIS — M2042 Other hammer toe(s) (acquired), left foot: Secondary | ICD-10-CM

## 2020-04-11 NOTE — Progress Notes (Signed)
She presents today for follow-up of her left foot.  Surgically she states is been doing very well however the second toe seems to be cocked up a little bit does not want to lay flat.  Objective: Vital signs are stable alert oriented x3.  Pulses are palpable.  There is no erythema edema cellulitis drainage or odor all of her surgical sites have gone to heal uneventfully he does have some dorsal contracture at the second metatarsophalangeal joint most likely associated with heavy scar tissue particularly that around the dorsal medial lateral aspect of the second metatarsal head.  This is easily palpable.  The remainder of the toes are sitting rectus.  Radiographs taken today demonstrate well healing osteotomies with double screw fixation second and fifth metatarsal and screw fixation of the capital osteotomy first left.  Assessment: Well-healed surgical foot with some contracture deformity at the second PIPJ.  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  Plan: I did recommend massage therapy to her today I also recommended that she consider a small surgical procedure in May which will give this about a year to completely resolve if it does not resolve then consider releasing the joint and possibly fusing it.  Tenotomy made suffice.

## 2020-05-09 DIAGNOSIS — M25551 Pain in right hip: Secondary | ICD-10-CM | POA: Insufficient documentation

## 2020-08-29 ENCOUNTER — Ambulatory Visit (INDEPENDENT_AMBULATORY_CARE_PROVIDER_SITE_OTHER): Payer: BC Managed Care – PPO

## 2020-08-29 ENCOUNTER — Ambulatory Visit (INDEPENDENT_AMBULATORY_CARE_PROVIDER_SITE_OTHER): Payer: BC Managed Care – PPO | Admitting: Podiatry

## 2020-08-29 ENCOUNTER — Other Ambulatory Visit: Payer: Self-pay

## 2020-08-29 DIAGNOSIS — M2010 Hallux valgus (acquired), unspecified foot: Secondary | ICD-10-CM | POA: Diagnosis not present

## 2020-08-29 DIAGNOSIS — M2012 Hallux valgus (acquired), left foot: Secondary | ICD-10-CM | POA: Diagnosis not present

## 2020-08-29 MED ORDER — METHYLPREDNISOLONE 4 MG PO TBPK: ORAL_TABLET | ORAL | 0 refills | Status: DC

## 2020-08-29 NOTE — Progress Notes (Signed)
She presents today date of surgery June 2021 she still concerned about some swelling in her left leg and foot as well as hammertoe deformity second left.  She goes on to say that she has been to see orthopedics who states that she is got a bad back with scoliosis hip dysplasia and arthritis.  Objective: Vital signs are stable she is alert oriented x3 pulses are strongly palpable.  Neurologic sensorium is intact.  Deep tendon reflexes are intact muscle strength is normal symmetrical she still has slightly contracted dorsally second metatarsophalangeal joint with PIPJ plantar flexor contraction.  Appears to be rigid in nature almost arthritic.  Does not demonstrate osteoarthritic changes on radiographs however.  Internal fixation is in good position all bones gone on to heal uneventfully radiographs demonstrate much decrease in edema from last visit however not completely gone as of yet particular across the dorsum of the foot.  Assessment scar tissue with contracture deformity second metatarsophalangeal joint the rest of it appears to be just mild edema.  Plan: Discussed etiology pathology conservative surgical therapies at this point I did recommend a steroid pack to see if we can get any more of the swelling out of the foot.  She is going to take this and follow-up with [place] on an as-needed basis.

## 2020-11-02 LAB — HM PAP SMEAR: HM Pap smear: NEGATIVE

## 2020-12-21 ENCOUNTER — Encounter: Payer: Self-pay | Admitting: Internal Medicine

## 2020-12-24 ENCOUNTER — Ambulatory Visit
Admission: RE | Admit: 2020-12-24 | Discharge: 2020-12-24 | Disposition: A | Discharge status: Home / Self Care | Payer: BC Managed Care – PPO | Attending: Gastroenterology | Admitting: Gastroenterology

## 2020-12-24 ENCOUNTER — Ambulatory Visit: Payer: BC Managed Care – PPO | Admitting: Certified Registered Nurse Anesthetist

## 2020-12-24 ENCOUNTER — Encounter: Payer: Self-pay | Admitting: Internal Medicine

## 2020-12-24 ENCOUNTER — Encounter
Admission: RE | Disposition: A | Discharge status: Home / Self Care | Payer: Self-pay | Source: Home / Self Care | Attending: Gastroenterology

## 2020-12-24 DIAGNOSIS — Z7952 Long term (current) use of systemic steroids: Secondary | ICD-10-CM | POA: Insufficient documentation

## 2020-12-24 DIAGNOSIS — K640 First degree hemorrhoids: Secondary | ICD-10-CM | POA: Insufficient documentation

## 2020-12-24 DIAGNOSIS — Z1211 Encounter for screening for malignant neoplasm of colon: Secondary | ICD-10-CM | POA: Diagnosis not present

## 2020-12-24 DIAGNOSIS — Z79899 Other long term (current) drug therapy: Secondary | ICD-10-CM | POA: Diagnosis not present

## 2020-12-24 DIAGNOSIS — Z791 Long term (current) use of non-steroidal anti-inflammatories (NSAID): Secondary | ICD-10-CM | POA: Diagnosis not present

## 2020-12-24 DIAGNOSIS — K621 Rectal polyp: Secondary | ICD-10-CM | POA: Insufficient documentation

## 2020-12-24 HISTORY — PX: COLONOSCOPY WITH PROPOFOL: SHX5780

## 2020-12-24 HISTORY — DX: Other specified congenital deformities of hip: Q65.89

## 2020-12-24 HISTORY — DX: Palpitations: R00.2

## 2020-12-24 LAB — POCT PREGNANCY, URINE: Preg Test, Ur: NEGATIVE

## 2020-12-24 SURGERY — COLONOSCOPY WITH PROPOFOL
Anesthesia: General

## 2020-12-24 MED ORDER — PROPOFOL 500 MG/50ML IV EMUL
INTRAVENOUS | Status: DC | PRN
  Administered 2020-12-24: 150 ug/kg/min via INTRAVENOUS

## 2020-12-24 MED ORDER — PROPOFOL 10 MG/ML IV BOLUS
INTRAVENOUS | Status: DC | PRN
  Administered 2020-12-24: 20 mg via INTRAVENOUS
  Administered 2020-12-24: 70 mg via INTRAVENOUS

## 2020-12-24 MED ORDER — SODIUM CHLORIDE 0.9 % IV SOLN: INTRAVENOUS | Status: DC

## 2020-12-24 MED ORDER — LIDOCAINE HCL (CARDIAC) PF 100 MG/5ML IV SOSY
PREFILLED_SYRINGE | INTRAVENOUS | Status: DC | PRN
  Administered 2020-12-24: 50 mg via INTRAVENOUS

## 2020-12-24 NOTE — Anesthesia Preprocedure Evaluation (Signed)
Anesthesia Evaluation  Patient identified by MRN, date of birth, ID band Patient awake    Reviewed: Allergy & Precautions, NPO status , Patient's Chart, lab work & pertinent test results  History of Anesthesia Complications Negative for: history of anesthetic complications  Airway Mallampati: I  TM Distance: >3 FB     Dental no notable dental hx.    Pulmonary    Pulmonary exam normal        Cardiovascular Exercise Tolerance: Good Normal cardiovascular examI     Neuro/Psych    GI/Hepatic   Endo/Other    Renal/GU      Musculoskeletal   Abdominal Normal abdominal exam  (+)   Peds  Hematology   Anesthesia Other Findings   Reproductive/Obstetrics                             Anesthesia Physical Anesthesia Plan  ASA: 1  Anesthesia Plan: General   Post-op Pain Management:    Induction: Intravenous  PONV Risk Score and Plan:   Airway Management Planned: Natural Airway and Simple Face Mask  Additional Equipment: None  Intra-op Plan:   Post-operative Plan:   Informed Consent: I have reviewed the patients History and Physical, chart, labs and discussed the procedure including the risks, benefits and alternatives for the proposed anesthesia with the patient or authorized representative who has indicated his/her understanding and acceptance.       Plan Discussed with:   Anesthesia Plan Comments:         Anesthesia Quick Evaluation

## 2020-12-24 NOTE — Op Note (Signed)
[facility] Patient Name: Tiffany Rasmussen Procedure Date: 12/24/2020 10:19 AM MRN: [id number] Account #: [id number] Date of Birth: 03/16/1966 Admit Type: Outpatient Age: 55 Room: [facility] ENDO ROOM 2 Gender: Female Note Status: Finalized Instrument Name: Colonoscope [id number] Procedure:             Colonoscopy Indications:           Screening for colorectal malignant neoplasm Providers:             Rueben Bash, DO Referring MD:          Lorie Apley. Jimmye Norman (Referring MD) Medicines:             Monitored Anesthesia Care Complications:         No immediate complications. Estimated blood loss:                         Minimal. Procedure:             Pre-Anesthesia Assessment:                        - Prior to the procedure, a History and Physical was                         performed, and patient medications and allergies were                         reviewed. The patient is competent. The risks and                         benefits of the procedure and the sedation options and                         risks were discussed with the patient. All questions                         were answered and informed consent was obtained.                         Patient identification and proposed procedure were                         verified by the physician, the nurse, the anesthetist                         and the technician in the endoscopy suite. Mental                         Status Examination: alert and oriented. Airway                         Examination: normal oropharyngeal airway and neck                         mobility. Respiratory Examination: clear to                         auscultation. CV Examination: RRR, no murmurs, no S3  or S4. Prophylactic Antibiotics: The patient does not                         require prophylactic antibiotics. Prior                         Anticoagulants: The patient has taken no previous                          anticoagulant or antiplatelet agents. ASA Grade                         Assessment: II - A patient with mild systemic disease.                         After reviewing the risks and benefits, the patient                         was deemed in satisfactory condition to undergo the                         procedure. The anesthesia plan was to use monitored                         anesthesia care (MAC). Immediately prior to                         administration of medications, the patient was                         re-assessed for adequacy to receive sedatives. The                         heart rate, respiratory rate, oxygen saturations,                         blood pressure, adequacy of pulmonary ventilation, and                         response to care were monitored throughout the                         procedure. The physical status of the patient was                         re-assessed after the procedure.                        After obtaining informed consent, the colonoscope was                         passed under direct vision. Throughout the procedure,                         the patient's blood pressure, pulse, and oxygen                         saturations were monitored continuously. The  Colonoscope was introduced through the anus and                         advanced to the the terminal ileum, with                         identification of the appendiceal orifice and IC                         valve. The colonoscopy was performed without                         difficulty. The patient tolerated the procedure well.                         The quality of the bowel preparation was evaluated                         using the BBPS [facility] Bowel Preparation Scale) with                         scores of: Right Colon = 3, Transverse Colon = 3 and                         Left Colon = 3 (entire mucosa seen well with no                          residual staining, small fragments of stool or opaque                         liquid). The total BBPS score equals 9. The terminal                         ileum, ileocecal valve, appendiceal orifice, and                         rectum were photographed. Findings:      The perianal and digital rectal examinations were normal. Pertinent       negatives include normal sphincter tone.      Non-bleeding internal hemorrhoids were found during retroflexion. The       hemorrhoids were Grade I (internal hemorrhoids that do not prolapse).      A 2 to 3 mm polyp was found in the rectum. The polyp was sessile. The       polyp was removed with a cold biopsy forceps. Resection and retrieval       were complete. Estimated blood loss was minimal.      The terminal ileum appeared normal. Estimated blood loss: none.      The exam was otherwise without abnormality on direct and retroflexion       views. Impression:            - Non-bleeding internal hemorrhoids.                        - One 2 to 3 mm polyp in the rectum, removed with a  cold biopsy forceps. Resected and retrieved.                        - The examined portion of the ileum was normal.                        - The examination was otherwise normal on direct and                         retroflexion views. Recommendation:        - Discharge patient to home.                        - Resume previous diet.                        - Continue present medications.                        - Await pathology results.                        - Repeat colonoscopy for surveillance based on                         pathology results.                        - Return to referring physician as previously                         scheduled.                        - add miralax for improvement in constipation Procedure Code(s):     --- Professional ---                        [phone], Colonoscopy, flexible; with biopsy, single or                          multiple Diagnosis Code(s):     --- Professional ---                        Z12.11, Encounter for screening for malignant neoplasm                         of colon                        K62.1, Rectal polyp                        K64.0, First degree hemorrhoids CPT copyright 2019 American Medical Association. All rights reserved. The codes documented in this report are preliminary and upon coder review may  be revised to meet current compliance requirements. Attending Participation:      I personally performed the entire procedure. Volney American, DO Annamaria Helling DO, DO 12/24/2020 11:11:58 AM This report has been signed electronically. Number of Addenda: 0 Note Initiated On: 12/24/2020 10:19 AM Scope Withdrawal Time: 0 hours 17 minutes 46 seconds  Total Procedure Duration: 0 hours 24  minutes 45 seconds  Estimated Blood Loss:  Estimated blood loss was minimal.      [facility]

## 2020-12-24 NOTE — Anesthesia Postprocedure Evaluation (Signed)
Anesthesia Post Note  Patient: Tiffany Rasmussen  Procedure(s) Performed: COLONOSCOPY WITH PROPOFOL  Patient location during evaluation: PACU Anesthesia Type: General Level of consciousness: awake and alert Pain management: pain level controlled Vital Signs Assessment: post-procedure vital signs reviewed and stable Respiratory status: spontaneous breathing, nonlabored ventilation, respiratory function stable and patient connected to nasal cannula oxygen Cardiovascular status: blood pressure returned to baseline and stable Postop Assessment: no apparent nausea or vomiting Anesthetic complications: no   No notable events documented.   Last Vitals:  Vitals:   12/24/20 1109 12/24/20 1119  BP: 109/60 136/84  Pulse: 78 77  Resp: 12 14  Temp: (!) 35.7 C   SpO2: 98% 97%    Last Pain:  Vitals:   12/24/20 1119  TempSrc:   PainSc: 0-No pain                 Willette Mudry Michae Kava

## 2020-12-24 NOTE — H&P (Signed)
[facility] Pre-Procedure H&P   Patient ID: Tiffany Rasmussen is a 55 y.o. female.  Gastroenterology Provider: Jaynie Collins, DO  Referring Provider: Pershing Proud, NP PCP: Grayland Jack, FNP  Date: 12/24/2020  HPI [name] is a 55 y.o. female who presents today for Colonoscopy for initial screening colonoscopy.  Deals with chronic constipation; 1-2 bm per week. Takes daily mobic, but hasn't since Friday.   BM have been regular without blood, melena, diarrhea. No other acute gi complaints.   Past Medical History:  Diagnosis Date   Hip dysplasia    Palpitations     Past Surgical History:  Procedure Laterality Date   BUNIONECTOMY     MANDIBLE SURGERY      Family History No h/o GI disease or malignancy  Review of Systems  Constitutional:  Negative for activity change, appetite change, fatigue, fever and unexpected weight change.  HENT:  Negative for trouble swallowing and voice change.   Respiratory:  Negative for shortness of breath and wheezing.   Cardiovascular:  Negative for chest pain and palpitations.  Gastrointestinal:  Negative for abdominal distention, abdominal pain, anal bleeding, blood in stool, constipation, diarrhea, nausea, rectal pain and vomiting.  Musculoskeletal:  Negative for arthralgias and myalgias.  Skin:  Negative for color change and pallor.  Neurological:  Negative for dizziness, syncope and weakness.  Psychiatric/Behavioral:  Negative for confusion.   All other systems reviewed and are negative.   Medications No current facility-administered medications on file prior to encounter.   Current Outpatient Medications on File Prior to Encounter  Medication Sig Dispense Refill   aspirin EC 81 MG tablet Take by mouth.     cyclobenzaprine (FLEXERIL) 10 MG tablet Take 5-10 mg by mouth at bedtime as needed.     gabapentin (NEURONTIN) 300 MG capsule Take 300 mg by mouth 3 (three) times daily.     meloxicam  (MOBIC) 15 MG tablet Take 15 mg by mouth daily.     metoprolol tartrate (LOPRESSOR) 25 MG tablet Take 25 mg by mouth daily.     LORazepam (ATIVAN) 0.5 MG tablet TAKE 1 TABLET BY MOUTH EVERY 8 HOURS AS NEEDED FOR ANXIETY     magnesium oxide (MAG-OX) 400 MG tablet Take by mouth.     methylPREDNISolone (MEDROL DOSEPAK) 4 MG TBPK tablet Take as directed 21 tablet 0   Multiple Vitamin (MULTI-VITAMIN) tablet Take by mouth.     ondansetron (ZOFRAN) 4 MG tablet Take 1 tablet (4 mg total) by mouth every 8 (eight) hours as needed. 20 tablet 0   Probiotic Product (PROBIOTIC DAILY PO) Take by mouth.     vitamin k 100 MCG tablet Take by mouth.      Pertinent medications related to GI and procedure were reviewed by me with the patient prior to the procedure   Current Facility-Administered Medications:    0.9 %  sodium chloride infusion, , Intravenous, Continuous, Jaynie Collins, DO  sodium chloride         Allergies  Allergen Reactions   Lactose Intolerance (Gi) Other (See Comments)    GI upset and bloating   Allergies were reviewed by me prior to the procedure  Objective    Vitals:   12/24/20 1013  BP: (!) 142/78  Pulse: 72  Resp: 18  Temp: 97.8 F (36.6 C)  TempSrc: Temporal  SpO2: 100%  Weight: 95.3 kg  Height: 6' (1.829 m)     Physical Exam Vitals reviewed.  Constitutional:  General: She is not in acute distress.    Appearance: Normal appearance. She is not ill-appearing, toxic-appearing or diaphoretic.  HENT:     Head: Normocephalic and atraumatic.     Nose: Nose normal.     Mouth/Throat:     Mouth: Mucous membranes are moist.     Pharynx: Oropharynx is clear.  Eyes:     General: No scleral icterus.    Extraocular Movements: Extraocular movements intact.  Cardiovascular:     Rate and Rhythm: Normal rate and regular rhythm.     Heart sounds: Normal heart sounds. No murmur heard.   No friction rub. No gallop.  Pulmonary:     Effort: Pulmonary effort is  normal. No respiratory distress.     Breath sounds: Normal breath sounds. No wheezing, rhonchi or rales.  Abdominal:     General: Abdomen is flat. Bowel sounds are normal. There is no distension.     Palpations: Abdomen is soft.     Tenderness: There is no abdominal tenderness. There is no guarding or rebound.  Musculoskeletal:     Cervical back: Neck supple.     Right lower leg: No edema.     Left lower leg: No edema.  Skin:    General: Skin is warm and dry.     Coloration: Skin is not jaundiced or pale.  Neurological:     General: No focal deficit present.     Mental Status: She is alert and oriented to person, place, and time. Mental status is at baseline.  Psychiatric:        Mood and Affect: Mood normal.        Behavior: Behavior normal.        Thought Content: Thought content normal.        Judgment: Judgment normal.     Assessment:  [name] is a 55 y.o. female  who presents today for Colonoscopy for initial screening colonoscopy.  Plan:  Colonoscopy with possible intervention today  Colonoscopy with possible biopsy, control of bleeding, polypectomy, and interventions as necessary has been discussed with the patient/patient representative. Informed consent was obtained from the patient/patient representative after explaining the indication, nature, and risks of the procedure including but not limited to death, bleeding, perforation, missed neoplasm/lesions, cardiorespiratory compromise, and reaction to medications. Opportunity for questions was given and appropriate answers were provided. Patient/patient representative has verbalized understanding is amenable to undergoing the procedure.   Jaynie Collins, DO  [facility]  Portions of the record may have been created with voice recognition software. Occasional wrong-word or 'sound-a-like' substitutions may have occurred due to the inherent limitations of voice recognition software.  Read the  chart carefully and recognize, using context, where substitutions may have occurred.

## 2020-12-24 NOTE — Interval H&P Note (Signed)
History and Physical Interval Note: Preprocedure H&P from 12/24/20  was reviewed and there was no interval change after seeing and examining the patient.  Written consent was obtained from the patient after discussion of risks, benefits, and alternatives. Patient has consented to proceed with Esophagogastroduodenoscopy with possible intervention   12/24/2020 10:34 AM  Tiffany Rasmussen  has presented today for surgery, with the diagnosis of SCREENING.  The various methods of treatment have been discussed with the patient and family. After consideration of risks, benefits and Rasmussen options for treatment, the patient has consented to  Procedure(s): COLONOSCOPY WITH PROPOFOL (N/A) as a surgical intervention.  The patient's history has been reviewed, patient examined, no change in status, stable for surgery.  I have reviewed the patient's chart and labs.  Questions were answered to the patient's satisfaction.     Jaynie Collins

## 2020-12-24 NOTE — Transfer of Care (Signed)
Immediate Anesthesia Transfer of Care Note  Patient: Tiffany Rasmussen  Procedure(s) Performed: COLONOSCOPY WITH PROPOFOL  Patient Location: Endoscopy Unit  Anesthesia Type:General  Level of Consciousness: drowsy  Airway & Oxygen Therapy: Patient Spontanous Breathing  Post-op Assessment: Report given to RN and Post -op Vital signs reviewed and stable  Post vital signs: Reviewed and stable  Last Vitals:  Vitals Value Taken Time  BP 109/60 12/24/20 1110  Temp 35.7 C 12/24/20 1109  Pulse 81 12/24/20 1110  Resp 17 12/24/20 1110  SpO2 87 % 12/24/20 1110  Vitals shown include unvalidated device data.  Last Pain:  Vitals:   12/24/20 1109  TempSrc: Tympanic  PainSc: Asleep         Complications: No notable events documented.

## 2020-12-25 ENCOUNTER — Encounter: Payer: Self-pay | Admitting: Gastroenterology

## 2020-12-25 LAB — SURGICAL PATHOLOGY

## 2021-09-12 HISTORY — PX: HIP SURGERY: SHX245

## 2021-10-02 ENCOUNTER — Other Ambulatory Visit: Payer: Self-pay | Admitting: Obstetrics and Gynecology

## 2021-10-02 ENCOUNTER — Other Ambulatory Visit: Payer: Self-pay | Admitting: Family Medicine

## 2021-10-02 DIAGNOSIS — Z1231 Encounter for screening mammogram for malignant neoplasm of breast: Secondary | ICD-10-CM

## 2021-12-10 ENCOUNTER — Ambulatory Visit
Admission: RE | Admit: 2021-12-10 | Discharge: 2021-12-10 | Disposition: A | Discharge status: Home / Self Care | Payer: BC Managed Care – PPO | Source: Ambulatory Visit | Attending: Family Medicine | Admitting: Family Medicine

## 2021-12-10 DIAGNOSIS — Z1231 Encounter for screening mammogram for malignant neoplasm of breast: Secondary | ICD-10-CM | POA: Diagnosis present

## 2022-03-05 ENCOUNTER — Emergency Department
Admission: EM | Admit: 2022-03-05 | Discharge: 2022-03-05 | Disposition: A | Discharge status: Home / Self Care | Payer: BC Managed Care – PPO | Attending: Emergency Medicine | Admitting: Emergency Medicine

## 2022-03-05 ENCOUNTER — Other Ambulatory Visit: Payer: Self-pay

## 2022-03-05 DIAGNOSIS — M7918 Myalgia, other site: Secondary | ICD-10-CM

## 2022-03-05 DIAGNOSIS — M2569 Stiffness of other specified joint, not elsewhere classified: Secondary | ICD-10-CM | POA: Insufficient documentation

## 2022-03-05 DIAGNOSIS — Y92481 Parking lot as the place of occurrence of the external cause: Secondary | ICD-10-CM | POA: Diagnosis not present

## 2022-03-05 DIAGNOSIS — M546 Pain in thoracic spine: Secondary | ICD-10-CM | POA: Diagnosis present

## 2022-03-05 MED ORDER — METHOCARBAMOL 500 MG PO TABS: ORAL_TABLET | ORAL | 0 refills | Status: AC

## 2022-03-05 MED ORDER — ETODOLAC 400 MG PO TABS: 400.0000 mg | ORAL_TABLET | Freq: Two times a day (BID) | ORAL | 0 refills | Status: AC
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
## 2022-11-05 LAB — HM PAP SMEAR: HM Pap smear: NEGATIVE

## 2022-11-06 ENCOUNTER — Other Ambulatory Visit: Payer: Self-pay | Admitting: Family Medicine

## 2022-11-06 DIAGNOSIS — Z1231 Encounter for screening mammogram for malignant neoplasm of breast: Secondary | ICD-10-CM

## 2022-11-25 ENCOUNTER — Other Ambulatory Visit: Payer: Self-pay

## 2022-12-12 ENCOUNTER — Ambulatory Visit
Admission: RE | Admit: 2022-12-12 | Discharge: 2022-12-12 | Disposition: A | Discharge status: Home / Self Care | Payer: BC Managed Care – PPO | Source: Ambulatory Visit | Attending: Family Medicine | Admitting: Family Medicine

## 2022-12-12 DIAGNOSIS — Z1231 Encounter for screening mammogram for malignant neoplasm of breast: Secondary | ICD-10-CM | POA: Insufficient documentation

## 2023-04-01 LAB — HM HEPATITIS C SCREENING LAB: HM Hepatitis Screen: NEGATIVE

## 2023-07-03 ENCOUNTER — Other Ambulatory Visit: Payer: Self-pay | Admitting: Medical Genetics

## 2023-09-25 ENCOUNTER — Ambulatory Visit (INDEPENDENT_AMBULATORY_CARE_PROVIDER_SITE_OTHER): Payer: Self-pay | Admitting: Family Medicine

## 2023-09-25 ENCOUNTER — Encounter: Payer: Self-pay | Admitting: Family Medicine

## 2023-09-25 VITALS — BP 117/80 | HR 76 | Temp 97.7°F | Resp 20 | Ht 72.0 in | Wt 235.0 lb

## 2023-09-25 DIAGNOSIS — F419 Anxiety disorder, unspecified: Secondary | ICD-10-CM

## 2023-09-25 MED ORDER — WEGOVY 0.25 MG/0.5ML ~~LOC~~ SOAJ: 0.2500 mg | SUBCUTANEOUS | 0 refills | Status: AC

## 2023-09-25 MED ORDER — LORAZEPAM 0.5 MG PO TABS: 0.5000 mg | ORAL_TABLET | Freq: Three times a day (TID) | ORAL | 0 refills | Status: AC | PRN

## 2023-09-25 MED ORDER — HYDROXYZINE HCL 50 MG PO TABS: 50.0000 mg | ORAL_TABLET | ORAL | 3 refills | Status: DC | PRN

## 2023-09-25 MED ORDER — ESCITALOPRAM OXALATE 5 MG PO TABS: 5.0000 mg | ORAL_TABLET | Freq: Every day | ORAL | 3 refills | Status: DC

## 2023-09-25 NOTE — Progress Notes (Signed)
 Established Patient Office Visit  Subjective   Patient ID: Tiffany Rasmussen, female    DOB: 05-29-1965  Age: 58 y.o. MRN: [id number]  Chief Complaint  Patient presents with   Establish Care    HPI Discussed the use of AI scribe software for clinical note transcription with the patient, who gave verbal consent to proceed.  History of Present Illness   Tiffany Rasmussen is a 58 year old female with anxiety, obesity, OA left hip, elevated LFTs, who presents with menopausal symptoms including hot flashes, insomnia, and irritability.  She has been experiencing menopausal symptoms, including hot flashes, insomnia, night sweats, and irritability, since around 2020. Hot flashes occur both during the day and night, leading to discomfort and affecting her sleep. Irritability is noted, particularly in interactions with her husband, along with restlessness.  She asked if she can start HRT.    She has a history of elevated liver enzymes, which were evaluated by a gastroenterologist and attributed to fatty liver. Further blood work was conducted, and the results were not concerning. She does not recall having an ultrasound of her liver.  Her current medications include meloxicam for left hip pain, hydroxyzine 50 mg taken once a week for relaxation, and metoprolol used as needed for heart palpitations, which she attributes to caffeine intake. She also mentions having Topamax, which was prescribed for weight loss but did not yield the desired results.  She describes herself as not having high anxiety but acknowledges internal stress, often noted by her massage therapist as tension in her shoulders. Her PHQ9 score is 10 and her GAD 7 score is 13.   No thoughts of self-harm or harm to others, except jokingly towards her husband.  Social history includes occasional alcohol consumption, typically one to two drinks on weekends, and no use of tobacco or illicit drugs.         ROS    Objective:     BP  117/80 (BP Location: Left Arm, Patient Position: Sitting, Cuff Size: Normal)   Pulse 76   Temp 97.7 F (36.5 C) (Oral)   Resp 20   Ht 6' (1.829 m)   Wt 235 lb (106.6 kg)   LMP  (LMP Unknown)   SpO2 100%   BMI 31.87 kg/m    Physical Exam Vitals reviewed.  Constitutional:      Appearance: Normal appearance.  HENT:     Head: Normocephalic.   Eyes:     General:        Right eye: No discharge.        Left eye: No discharge.   Pulmonary:     Effort: Pulmonary effort is normal.   Neurological:     Mental Status: She is alert and oriented to person, place, and time.   Psychiatric:        Mood and Affect: Mood normal.        Behavior: Behavior normal.        Thought Content: Thought content normal.        Judgment: Judgment normal.          No results found for any visits on 09/25/23.    The ASCVD Risk score (Arnett DK, et al., 2019) failed to calculate for the following reasons:   Cannot find a previous HDL lab   Cannot find a previous total cholesterol lab    Assessment & Plan:  Anxiety -     hydrOXYzine HCl; Take 1 tablet (50 mg total) by mouth  as needed for anxiety.  Dispense: 30 tablet; Refill: 3 -     LORazepam; Take 1 tablet (0.5 mg total) by mouth every 8 (eight) hours as needed. for anxiety  Dispense: 30 tablet; Refill: 0 -     Escitalopram Oxalate; Take 1 tablet (5 mg total) by mouth daily.  Dispense: 30 tablet; Refill: 3  Morbid obesity (HCC) -     WUJWJX; Inject 0.25 mg into the skin once a week. Use this dose for 1 month (4 shots) and then increase to next higher dose.  Dispense: 2 mL; Refill: 0  Assessment and Plan    Menopausal symptoms Experiencing hot flashes, night sweats, insomnia, and irritability. Discussed HRT risks and alternatives like low-dose citalopram or Lexapro for symptom management. - Prescribe low-dose Lexapro for anxiety and hot flashes. - Consult with OBGYN regarding hormone replacement therapy.  Anxiety Reports internal  anxiety with tension in shoulders. No current use of Xanax or self-harm thoughts. - Prescribe low-dose Lexapro for anxiety and hot flashes.  Weight management Interested in weight loss. Discussed Wegovy and phentermine options. Motivated to lose weight, plans to verify insurance coverage for [facility]. - Prescribe Wegovy if insurance covers it. - Check with insurance regarding Wegovy coverage. - Consider phentermine if Frederik Jansky is not covered and if suitable.  Fatty liver Diagnosed with fatty liver, previously elevated liver enzymes. Managing by avoiding alcohol and monitoring health.  General Health Maintenance Occasional alcohol use, no smoking or drug use. Uses hydroxyzine and metoprolol as needed. - Refill hydroxyzine prescription.         Return in about 4 weeks (around 10/23/2023).    Jessey Huyett K Ellysa Parrack, MD

## 2023-10-17 ENCOUNTER — Other Ambulatory Visit: Payer: Self-pay | Admitting: Family Medicine

## 2023-10-17 DIAGNOSIS — F419 Anxiety disorder, unspecified: Secondary | ICD-10-CM

## 2023-10-26 ENCOUNTER — Encounter: Payer: Self-pay | Admitting: Family Medicine

## 2023-10-26 ENCOUNTER — Ambulatory Visit (INDEPENDENT_AMBULATORY_CARE_PROVIDER_SITE_OTHER): Payer: Self-pay | Admitting: Family Medicine

## 2023-10-26 VITALS — Ht 72.0 in | Wt 230.0 lb

## 2023-10-26 DIAGNOSIS — Z1231 Encounter for screening mammogram for malignant neoplasm of breast: Secondary | ICD-10-CM

## 2023-10-26 MED ORDER — PHENTERMINE HCL 37.5 MG PO CAPS: ORAL_CAPSULE | ORAL | 0 refills | Status: DC

## 2023-10-26 NOTE — Progress Notes (Signed)
 Established Patient Office Visit  Subjective   Patient ID: Tiffany Rasmussen, female    DOB: 1966/03/19  Age: 58 y.o. MRN: [id number]  Chief Complaint  Patient presents with   Medical Management of Chronic Issues    HPI Delightful 58 year old female with anxiety, obesity, OA left hip, elevated LFTs, who presents with menopausal symptoms including hot flashes, insomnia, and irritability.   Discussed the use of AI scribe software for clinical note transcription with the patient, who gave verbal consent to proceed.  History of Present Illness   Tiffany Rasmussen is a 58 year old female who presents for a follow-up visit for anxiety management.  She has been taking escitalopram  5 mg for anxiety and has noticed an improvement in her symptoms. Occasionally, she forgets to take the medication at night but has been doing better with remembering.  She has been using an over-the-counter product called 'Slenderize' for weight loss, which involves two droppers: one for appetite suppression and another for energy. She has lost four to five pounds since starting it and has not experienced any palpitations or other side effects. She recalls trying Topamax in the past for weight loss but did not find it effective. She also mentions a previous discussion about phentermine  but was concerned about potential palpitations.  She uses hydroxyzine  occasionally for anxiety, particularly on Friday nights, and finds it effective for relaxation and sleep. She takes it around 6 PM and sleeps well through the night, waking up refreshed the next morning. She notes that taking it more frequently would leave her feeling 'foggy' the next day.     ROC: 10/25/2018: total cholesterol 205, TRIGS 81, HDL 96, LDL 92.     Objective:     Ht 6' (1.829 m)   Wt 230 lb (104.3 kg)   BMI 31.19 kg/m    Physical Exam Vitals and nursing note reviewed.  Constitutional:      Appearance: Normal appearance.  HENT:     Head:  Normocephalic and atraumatic.  Eyes:     Conjunctiva/sclera: Conjunctivae normal.  Cardiovascular:     Rate and Rhythm: Normal rate and regular rhythm.  Pulmonary:     Effort: Pulmonary effort is normal.     Breath sounds: Normal breath sounds.  Musculoskeletal:     Right lower leg: No edema.     Left lower leg: No edema.  Skin:    General: Skin is warm and dry.  Neurological:     Mental Status: She is alert and oriented to person, place, and time.  Psychiatric:        Mood and Affect: Mood normal.        Behavior: Behavior normal.        Thought Content: Thought content normal.        Judgment: Judgment normal.          No results found for any visits on 10/26/23.    The ASCVD Risk score (Arnett DK, et al., 2019) failed to calculate for the following reasons:   Cannot find a previous HDL lab   Cannot find a previous total cholesterol lab     Assessment & Plan:  Encounter for screening mammogram for malignant neoplasm of breast -     3D Screening Mammogram, Left and Right; Future  Morbid obesity (HCC) -     Phentermine  HCl; One capsule by mouth qAM  Dispense: 30 capsule; Refill: 0  Assessment and Plan    Generalized Anxiety Disorder Improvement  in anxiety symptoms with escitalopram  5 mg. Occasionally forgets medication but adherence is improving. Uses hydroxyzine  effectively for anxiety, particularly for sleep on Friday nights. No adverse effects from either medication. Long-term use of escitalopram  is common and generally well-tolerated. - Continue escitalopram  5 mg daily - Refill escitalopram  prescription - Continue hydroxyzine  as needed for anxiety  Obesity Previously prescribed Wegovy  for weight loss but did not obtain it due to cost. Using over-the-counter Slenderize, which aids appetite suppression and resulted in a weight loss of 4-5 pounds. Concern about potential ephedra content. No palpitations reported. Discussed phentermine , an FDA-approved medication,  for weight loss. Prefers FDA-approved medications due to known safety profile. - Prescribe phentermine  37.5 mg once daily - Discontinue Slenderize - Follow up in one month to evaluate response to phentermine   General Health Maintenance Due for a mammogram. Pap smear not yet due based on age and history of normal results. - Order mammogram - Review Pap smear history and determine if due      Return in about 4 weeks (around 11/23/2023).    Tiffany Charters K Kam Kushnir, MD

## 2023-10-27 ENCOUNTER — Telehealth: Payer: Self-pay

## 2023-10-27 ENCOUNTER — Encounter: Payer: Self-pay | Admitting: Family Medicine

## 2023-10-27 NOTE — Telephone Encounter (Signed)
(  Key: [facility])  PA Case ID #: [id number]  Rx #: [id number]   Status Sent to Plan today  Drug Phentermine  HCl 37.5MG  capsules  Form [employer] Electronic PA Form [phone] NCPDP) Original Claim Info 75 PRIOR AUTH REQ-MD CALL [phone].DRUG REQUIRES PRIOR AUTHORIZATION

## 2023-11-30 ENCOUNTER — Ambulatory Visit: Payer: Self-pay | Admitting: Family Medicine

## 2023-12-08 ENCOUNTER — Ambulatory Visit (INDEPENDENT_AMBULATORY_CARE_PROVIDER_SITE_OTHER): Payer: Self-pay | Admitting: Family Medicine

## 2023-12-08 ENCOUNTER — Encounter: Payer: Self-pay | Admitting: Family Medicine

## 2023-12-08 VITALS — BP 136/86 | HR 70 | Temp 98.0°F | Resp 18 | Ht 72.0 in | Wt 233.0 lb

## 2023-12-08 DIAGNOSIS — N951 Menopausal and female climacteric states: Secondary | ICD-10-CM

## 2023-12-08 MED ORDER — PHENTERMINE HCL 37.5 MG PO CAPS: ORAL_CAPSULE | ORAL | 0 refills | Status: DC

## 2023-12-08 NOTE — Progress Notes (Unsigned)
   Established Patient Office Visit  Subjective   Patient ID: Tiffany Rasmussen, female    DOB: 18-Apr-1965  Age: 58 y.o. MRN: [id number]  Chief Complaint  Patient presents with  . Medical Management of Chronic Issues    HPI   {History (Optional):23778}  ROS    Objective:     BP (!) 147/85   Pulse 72   Temp 98 F (36.7 C) (Oral)   Resp 18   Ht 6' (1.829 m)   Wt 233 lb (105.7 kg)   SpO2 98%   BMI 31.60 kg/m  {Vitals History (Optional):23777}  Physical Exam ***  {Perform Simple Foot Exam  Perform Detailed exam:1} {Insert foot Exam (Optional):30965}   No results found for any visits on 12/08/23.  {Labs (Optional):23779}  The ASCVD Risk score (Arnett DK, et al., 2019) failed to calculate for the following reasons:   Cannot find a previous HDL lab   Cannot find a previous total cholesterol lab    Assessment & Plan:  Morbid obesity (HCC) -     Phentermine  HCl; One capsule by mouth qAM  Dispense: 30 capsule; Refill: 0     Return in about 4 weeks (around 01/05/2024).    Rhylee Pucillo K Lisandro Meggett, MD

## 2023-12-10 DIAGNOSIS — N951 Menopausal and female climacteric states: Secondary | ICD-10-CM | POA: Insufficient documentation

## 2023-12-10 NOTE — Assessment & Plan Note (Signed)
 Doing better with escitalopram .

## 2023-12-10 NOTE — Assessment & Plan Note (Signed)
 Continue phentermine  37.5 mg once daily.  Please work on increasing your exercise and a healthy diet high in protein and low in carbohydrates and sweets.

## 2023-12-16 ENCOUNTER — Ambulatory Visit
Admission: RE | Admit: 2023-12-16 | Discharge: 2023-12-16 | Disposition: A | Discharge status: Home / Self Care | Payer: Self-pay | Source: Ambulatory Visit | Attending: Family Medicine | Admitting: Family Medicine

## 2023-12-16 DIAGNOSIS — Z1231 Encounter for screening mammogram for malignant neoplasm of breast: Secondary | ICD-10-CM | POA: Diagnosis present

## 2023-12-30 ENCOUNTER — Ambulatory Visit: Payer: Self-pay

## 2023-12-30 DIAGNOSIS — R002 Palpitations: Secondary | ICD-10-CM

## 2023-12-30 DIAGNOSIS — Z136 Encounter for screening for cardiovascular disorders: Secondary | ICD-10-CM

## 2023-12-31 LAB — COMPREHENSIVE METABOLIC PANEL WITH GFR
ALT: 17 IU/L (ref 0–32)
AST: 26 IU/L (ref 0–40)
Albumin: 4.5 g/dL (ref 3.8–4.9)
Alkaline Phosphatase: 67 IU/L (ref 49–135)
BUN/Creatinine Ratio: 15 (ref 9–23)
BUN: 13 mg/dL (ref 6–24)
Bilirubin Total: 0.3 mg/dL (ref 0.0–1.2)
CO2: 24 mmol/L (ref 20–29)
Calcium: 9.8 mg/dL (ref 8.7–10.2)
Chloride: 103 mmol/L (ref 96–106)
Creatinine, Ser: 0.86 mg/dL (ref 0.57–1.00)
Globulin, Total: 2.5 g/dL (ref 1.5–4.5)
Glucose: 81 mg/dL (ref 70–99)
Potassium: 4.8 mmol/L (ref 3.5–5.2)
Sodium: 140 mmol/L (ref 134–144)
Total Protein: 7 g/dL (ref 6.0–8.5)
eGFR: 80 mL/min/1.73 (ref >59)

## 2023-12-31 LAB — CBC WITH DIFFERENTIAL/PLATELET
Basophils Absolute: 0 x10E3/uL (ref 0.0–0.2)
Basos: 1 %
EOS (ABSOLUTE): 0.1 x10E3/uL (ref 0.0–0.4)
Eos: 2 %
Hematocrit: 36.6 % (ref 34.0–46.6)
Hemoglobin: 12 g/dL (ref 11.1–15.9)
Immature Grans (Abs): 0 x10E3/uL (ref 0.0–0.1)
Immature Granulocytes: 0 %
Lymphocytes Absolute: 0.9 x10E3/uL (ref 0.7–3.1)
Lymphs: 23 %
MCH: 31.2 pg (ref 26.6–33.0)
MCHC: 32.8 g/dL (ref 31.5–35.7)
MCV: 95 fL (ref 79–97)
Monocytes Absolute: 0.3 x10E3/uL (ref 0.1–0.9)
Monocytes: 6 %
Neutrophils Absolute: 2.6 x10E3/uL (ref 1.4–7.0)
Neutrophils: 68 %
Platelets: 325 x10E3/uL (ref 150–450)
RBC: 3.85 x10E6/uL (ref 3.77–5.28)
RDW: 12.5 % (ref 11.7–15.4)
WBC: 3.9 x10E3/uL (ref 3.4–10.8)

## 2023-12-31 LAB — LIPID PANEL
Chol/HDL Ratio: 2.2 ratio (ref 0.0–4.4)
Cholesterol, Total: 207 mg/dL — ABNORMAL HIGH (ref 100–199)
HDL: 93 mg/dL (ref >39)
LDL Chol Calc (NIH): 106 mg/dL — ABNORMAL HIGH (ref 0–99)
Triglycerides: 44 mg/dL (ref 0–149)
VLDL Cholesterol Cal: 8 mg/dL (ref 5–40)

## 2023-12-31 LAB — HEMOGLOBIN A1C
Est. average glucose Bld gHb Est-mCnc: 114 mg/dL
Hgb A1c MFr Bld: 5.6 % (ref 4.8–5.6)

## 2023-12-31 LAB — TSH: TSH: 2.11 u[IU]/mL (ref 0.450–4.500)

## 2024-01-01 ENCOUNTER — Ambulatory Visit: Payer: Self-pay | Admitting: Family Medicine

## 2024-01-05 ENCOUNTER — Ambulatory Visit (INDEPENDENT_AMBULATORY_CARE_PROVIDER_SITE_OTHER): Payer: Self-pay | Admitting: Family Medicine

## 2024-01-05 ENCOUNTER — Encounter: Payer: Self-pay | Admitting: Family Medicine

## 2024-01-05 MED ORDER — PHENTERMINE HCL 37.5 MG PO CAPS: ORAL_CAPSULE | ORAL | 0 refills | Status: AC

## 2024-01-05 NOTE — Assessment & Plan Note (Signed)
 She is doing well with phentermine  37.5 mg daily.  She lost 6 pounds in the last month.  She is watching what she eats and is getting more exercise.  Refill phentermine  for this month which will be her third month in a row.  After this up coming month lets take a break for 2 months and then you can start phentermine  again in January.

## 2024-01-05 NOTE — Progress Notes (Signed)
   Established Patient Office Visit  Subjective   Patient ID: Tiffany Rasmussen, female    DOB: 02-05-1966  Age: 58 y.o. MRN: [id number]  Chief Complaint  Patient presents with   Medical Management of Chronic Issues    HPI Delightful 58 year old female with anxiety, obesity, OA left hip, elevated LFTs, who presents with menopausal symptoms including hot flashes, insomnia, and irritability, colonoscopy 12/24/2020 with ten year FOLLOW-UP.    She is doing very well.  She has lost 6 pounds since August taking phentermine  37.5 daily.  She has only been on this 2 months.  She reports no difficulty taking phentermine .  Her blood pressure is 128/82. She has increased her activity level and gets at least 8K steps a day.  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 She reports her hot flashes are better but she still has 1 every morning.  Reviewed her labs with her.  Her HDL cholesterol is 93.     Objective:     BP 128/82 (BP Location: Left Arm, Patient Position: Sitting, Cuff Size: Normal)   Pulse 77   Temp 98.3 F (36.8 C) (Oral)   Resp 18   Ht 6' (1.829 m)   Wt 227 lb (103 kg)   SpO2 98%   BMI 30.79 kg/m    Physical Exam Vitals and nursing note reviewed.  Constitutional:      Appearance: Normal appearance.  HENT:     Head: Normocephalic and atraumatic.  Eyes:     Conjunctiva/sclera: Conjunctivae normal.  Cardiovascular:     Rate and Rhythm: Normal rate and regular rhythm.  Pulmonary:     Effort: Pulmonary effort is normal.     Breath sounds: Normal breath sounds.  Musculoskeletal:     Right lower leg: No edema.     Left lower leg: No edema.  Skin:    General: Skin is warm and dry.  Neurological:     Mental Status: She is alert and oriented to person, place, and time.  Psychiatric:        Mood and Affect: Mood normal.        Behavior: Behavior normal.        Thought Content: Thought content normal.        Judgment: Judgment  normal.          No results found for any visits on 01/05/24.    The 10-year ASCVD risk score (Arnett DK, et al., 2019) is: 2%    Assessment & Plan:  Morbid obesity (HCC) Assessment & Plan: She is doing well with phentermine  37.5 mg daily.  She lost 6 pounds in the last month.  She is watching what she eats and is getting more exercise.  Refill phentermine  for this month which will be her third month in a row.  After this up coming month lets take a break for 2 months and then you can start phentermine  again in January.  Orders: -     Phentermine  HCl; One capsule by mouth qAM  Dispense: 30 capsule; Refill: 0     Return in about 3 months (around 04/05/2024).    Glenford Garis K Angelica Wix, MD

## 2024-02-04 ENCOUNTER — Other Ambulatory Visit: Payer: Self-pay | Admitting: Medical Genetics

## 2024-02-04 DIAGNOSIS — Z006 Encounter for examination for normal comparison and control in clinical research program: Secondary | ICD-10-CM

## 2024-04-08 LAB — GENECONNECT MOLECULAR SCREEN: Genetic Analysis Overall Interpretation: NEGATIVE

## 2024-07-04 ENCOUNTER — Ambulatory Visit: Admitting: Family Medicine
